# Patient Record
Sex: Female | Born: 1975
Health system: Southern US, Community
[De-identification: ages and names within clinical notes are randomized; demographics above are authoritative.]

---

## 2018-10-07 DIAGNOSIS — E119 Type 2 diabetes mellitus without complications: Secondary | ICD-10-CM

## 2018-10-07 HISTORY — DX: Type 2 diabetes mellitus without complications: E11.9

## 2020-01-24 ENCOUNTER — Inpatient Hospital Stay (HOSPITAL_COMMUNITY)
Admission: EM | Admit: 2020-01-24 | Discharge: 2020-01-27 | DRG: 041 | Disposition: A | Discharge status: Home / Self Care | Payer: Self-pay | Attending: Internal Medicine | Admitting: Internal Medicine

## 2020-01-24 ENCOUNTER — Emergency Department (HOSPITAL_COMMUNITY): Payer: Self-pay

## 2020-01-24 DIAGNOSIS — M869 Osteomyelitis, unspecified: Secondary | ICD-10-CM

## 2020-01-24 DIAGNOSIS — Z79899 Other long term (current) drug therapy: Secondary | ICD-10-CM

## 2020-01-24 DIAGNOSIS — Z20822 Contact with and (suspected) exposure to covid-19: Secondary | ICD-10-CM | POA: Diagnosis present

## 2020-01-24 DIAGNOSIS — E1140 Type 2 diabetes mellitus with diabetic neuropathy, unspecified: Principal | ICD-10-CM | POA: Diagnosis present

## 2020-01-24 DIAGNOSIS — L03115 Cellulitis of right lower limb: Principal | ICD-10-CM | POA: Diagnosis present

## 2020-01-24 DIAGNOSIS — E11621 Type 2 diabetes mellitus with foot ulcer: Secondary | ICD-10-CM | POA: Diagnosis present

## 2020-01-24 DIAGNOSIS — E1165 Type 2 diabetes mellitus with hyperglycemia: Secondary | ICD-10-CM | POA: Diagnosis present

## 2020-01-24 DIAGNOSIS — L089 Local infection of the skin and subcutaneous tissue, unspecified: Secondary | ICD-10-CM | POA: Diagnosis present

## 2020-01-24 DIAGNOSIS — L97516 Non-pressure chronic ulcer of other part of right foot with bone involvement without evidence of necrosis: Secondary | ICD-10-CM | POA: Diagnosis present

## 2020-01-24 DIAGNOSIS — E1169 Type 2 diabetes mellitus with other specified complication: Secondary | ICD-10-CM | POA: Diagnosis present

## 2020-01-24 LAB — COMPREHENSIVE METABOLIC PANEL
ALT: 22 U/L (ref 0–44)
AST: 18 U/L (ref 15–41)
Albumin: 3.3 g/dL — ABNORMAL LOW (ref 3.5–5.0)
Alkaline Phosphatase: 145 U/L — ABNORMAL HIGH (ref 38–126)
Anion gap: 12 (ref 5–15)
BUN: 9 mg/dL (ref 6–20)
CO2: 23 mmol/L (ref 22–32)
Calcium: 9.1 mg/dL (ref 8.9–10.3)
Chloride: 97 mmol/L — ABNORMAL LOW (ref 98–111)
Creatinine, Ser: 0.86 mg/dL (ref 0.44–1.00)
GFR, Estimated: >60 mL/min (ref >60)
Glucose, Bld: 491 mg/dL — ABNORMAL HIGH (ref 70–99)
Potassium: 4.1 mmol/L (ref 3.5–5.1)
Sodium: 132 mmol/L — ABNORMAL LOW (ref 135–145)
Total Bilirubin: 0.5 mg/dL (ref 0.3–1.2)
Total Protein: 7.4 g/dL (ref 6.5–8.1)

## 2020-01-24 LAB — CBC
HCT: 41 % (ref 36.0–46.0)
Hemoglobin: 14.1 g/dL (ref 12.0–15.0)
MCH: 31.5 pg (ref 26.0–34.0)
MCHC: 34.4 g/dL (ref 30.0–36.0)
MCV: 91.5 fL (ref 80.0–100.0)
Platelets: 272 10*3/uL (ref 150–400)
RBC: 4.48 MIL/uL (ref 3.87–5.11)
RDW: 12 % (ref 11.5–15.5)
WBC: 10.4 10*3/uL (ref 4.0–10.5)
nRBC: 0 % (ref 0.0–0.2)

## 2020-01-24 LAB — LACTIC ACID, PLASMA: Lactic Acid, Venous: 2.6 mmol/L (ref 0.5–1.9)

## 2020-01-24 IMAGING — CR DG FOOT COMPLETE 3+V*R*
3 series · 3 of 3 positions shown · non-contrast
Comparison: None.

CLINICAL DATA: Right foot swelling.  Wound near the fifth toe

EXAM:
RIGHT FOOT COMPLETE - 3+ VIEW

[foot ap]
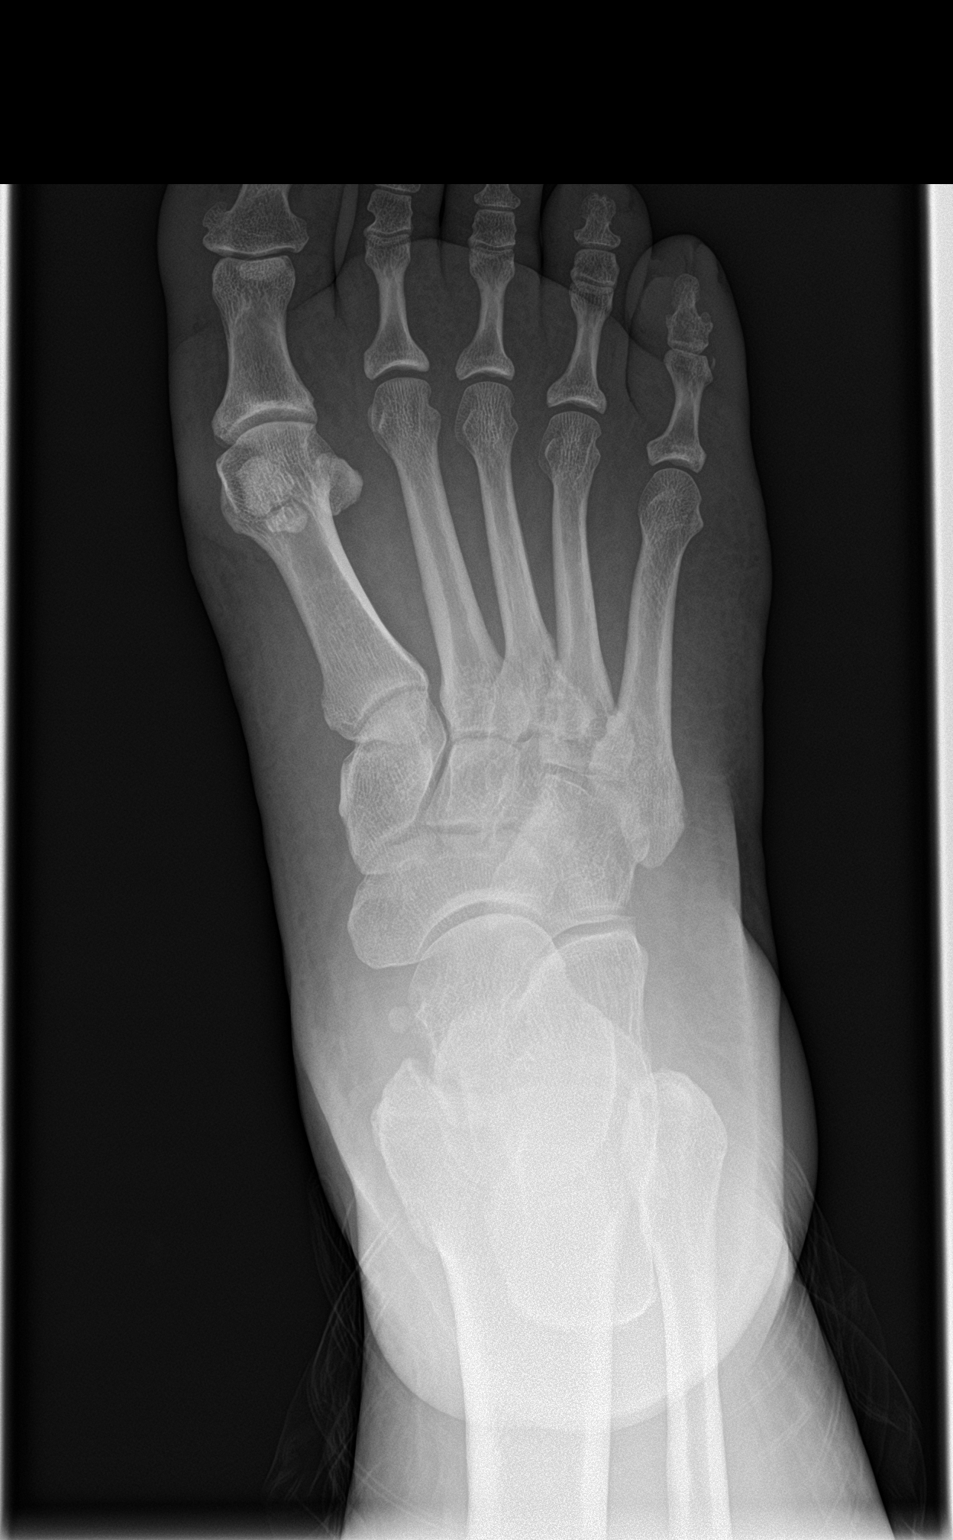

[foot obl]
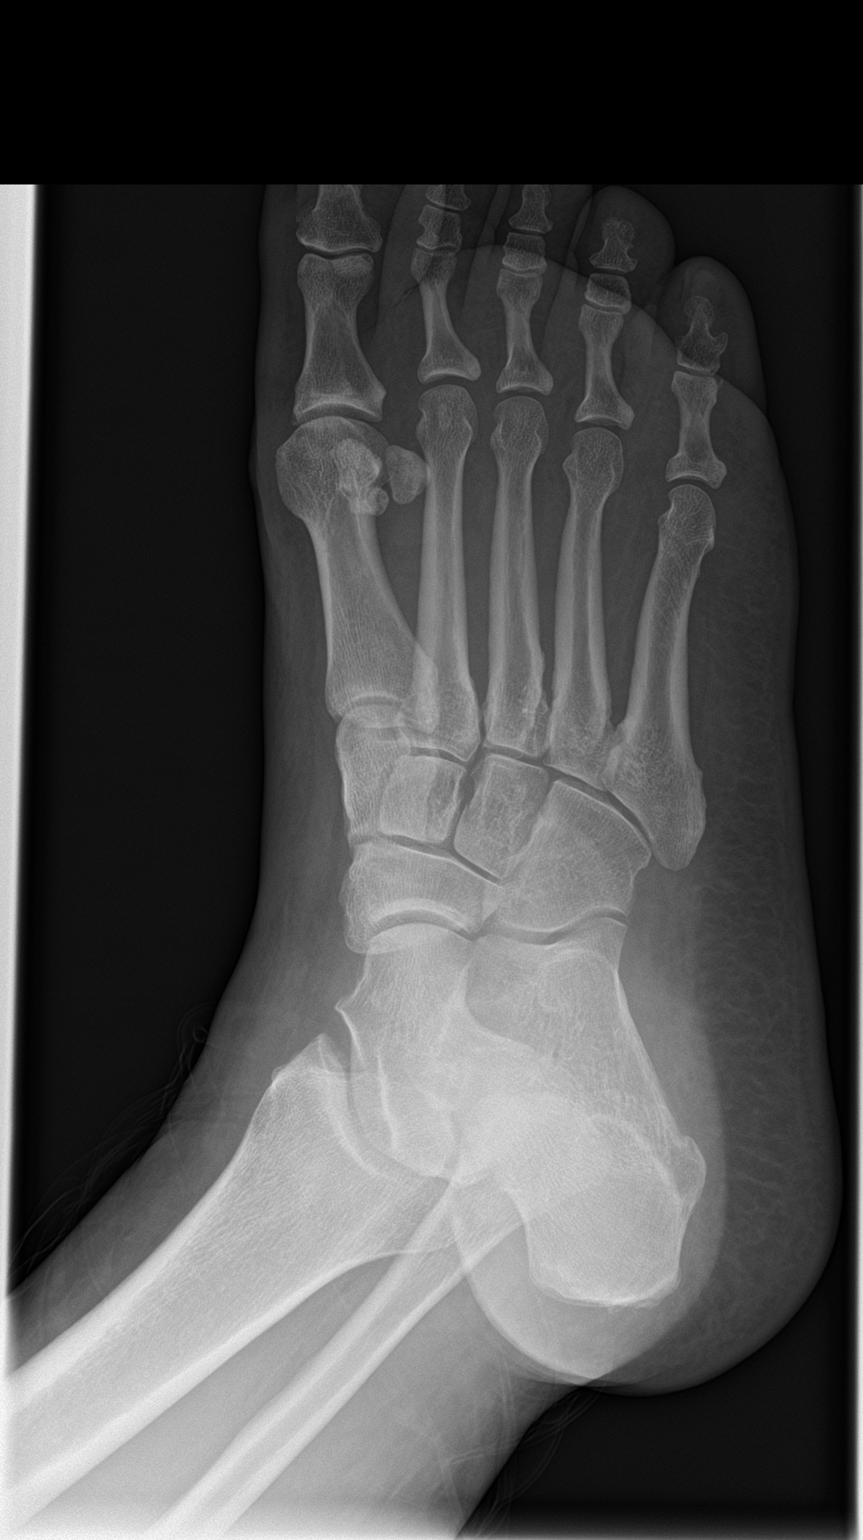

[foot lat]
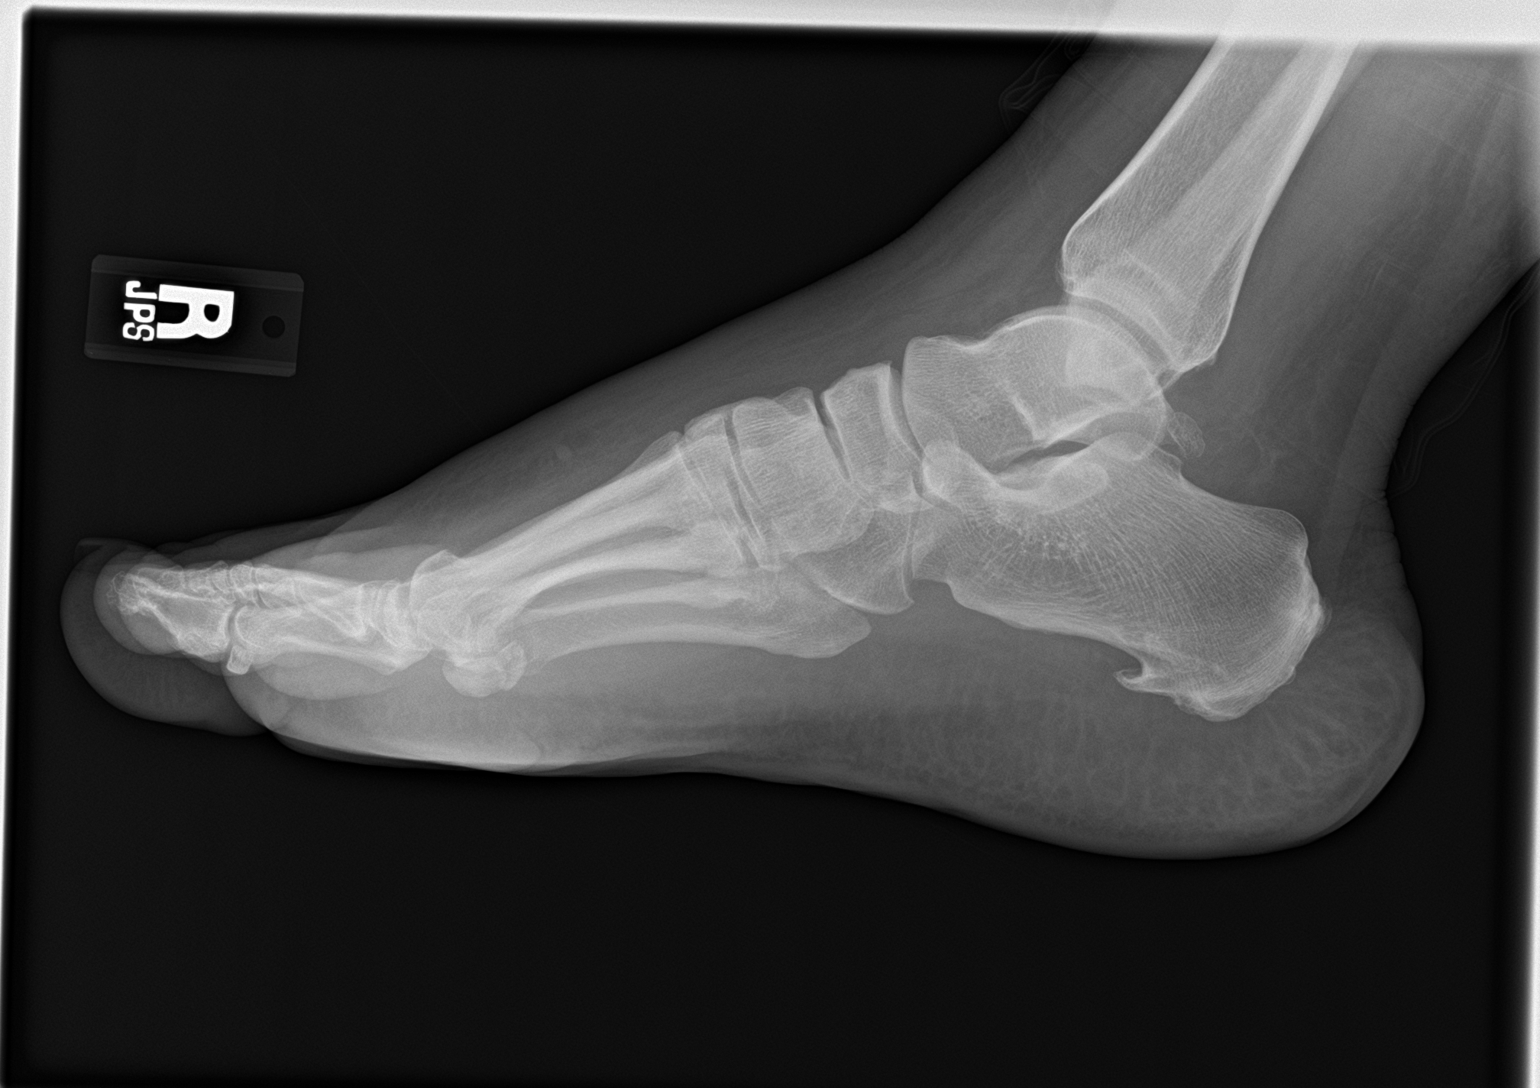

[3 of 3 positions shown; findings below may reference images not displayed]

FINDINGS: There is no evidence of fracture or dislocation. Small nonspecific
mineralized density along the lateral aspect of the fifth toe IP
joint, possibly a site of calcific periarthritis. No bony erosion or
periostitis. Small plantar calcaneal spur. There is no evidence of
arthropathy or other focal bone abnormality. No focal soft tissue
swelling, obvious ulceration, or soft tissue gas.
IMPRESSION: 1. No acute osseous abnormality. No radiographic evidence of
osteomyelitis.
2. Small nonspecific mineralized density along the lateral aspect of
the fifth toe IP joint, possibly a site of calcific periarthritis.

## 2020-01-24 NOTE — ED Triage Notes (Signed)
Pt here from home with c/o right foot and ankle swollen over the last 2 days , small toe has a wound on it and foot and ankle is swollen and red , no fever

## 2020-01-25 ENCOUNTER — Other Ambulatory Visit: Payer: Self-pay

## 2020-01-25 ENCOUNTER — Observation Stay (HOSPITAL_BASED_OUTPATIENT_CLINIC_OR_DEPARTMENT_OTHER): Payer: Self-pay

## 2020-01-25 ENCOUNTER — Observation Stay (HOSPITAL_COMMUNITY): Payer: Self-pay

## 2020-01-25 ENCOUNTER — Encounter (HOSPITAL_COMMUNITY): Payer: Self-pay | Admitting: Family Medicine

## 2020-01-25 ENCOUNTER — Other Ambulatory Visit: Payer: Self-pay | Admitting: Physician Assistant

## 2020-01-25 DIAGNOSIS — M869 Osteomyelitis, unspecified: Secondary | ICD-10-CM

## 2020-01-25 DIAGNOSIS — L0390 Cellulitis, unspecified: Secondary | ICD-10-CM

## 2020-01-25 DIAGNOSIS — E1165 Type 2 diabetes mellitus with hyperglycemia: Secondary | ICD-10-CM | POA: Diagnosis present

## 2020-01-25 DIAGNOSIS — L089 Local infection of the skin and subcutaneous tissue, unspecified: Secondary | ICD-10-CM | POA: Diagnosis present

## 2020-01-25 DIAGNOSIS — I739 Peripheral vascular disease, unspecified: Secondary | ICD-10-CM

## 2020-01-25 LAB — RESPIRATORY PANEL BY RT PCR (FLU A&B, COVID)
Influenza A by PCR: NEGATIVE
Influenza B by PCR: NEGATIVE
SARS Coronavirus 2 by RT PCR: NEGATIVE

## 2020-01-25 LAB — CBC WITH DIFFERENTIAL/PLATELET
Abs Immature Granulocytes: 0.03 10*3/uL (ref 0.00–0.07)
Basophils Absolute: 0 10*3/uL (ref 0.0–0.1)
Basophils Relative: 0 %
Eosinophils Absolute: 0.1 10*3/uL (ref 0.0–0.5)
Eosinophils Relative: 1 %
HCT: 36.3 % (ref 36.0–46.0)
Hemoglobin: 12.2 g/dL (ref 12.0–15.0)
Immature Granulocytes: 0 %
Lymphocytes Relative: 26 %
Lymphs Abs: 2.4 10*3/uL (ref 0.7–4.0)
MCH: 30.7 pg (ref 26.0–34.0)
MCHC: 33.6 g/dL (ref 30.0–36.0)
MCV: 91.4 fL (ref 80.0–100.0)
Monocytes Absolute: 0.7 10*3/uL (ref 0.1–1.0)
Monocytes Relative: 7 %
Neutro Abs: 6.1 10*3/uL (ref 1.7–7.7)
Neutrophils Relative %: 66 %
Platelets: 252 10*3/uL (ref 150–400)
RBC: 3.97 MIL/uL (ref 3.87–5.11)
RDW: 12.4 % (ref 11.5–15.5)
WBC: 9.3 10*3/uL (ref 4.0–10.5)
nRBC: 0 % (ref 0.0–0.2)

## 2020-01-25 LAB — GLUCOSE, CAPILLARY: Glucose-Capillary: 320 mg/dL — ABNORMAL HIGH (ref 70–99)

## 2020-01-25 LAB — BASIC METABOLIC PANEL
Anion gap: 11 (ref 5–15)
BUN: 13 mg/dL (ref 6–20)
CO2: 24 mmol/L (ref 22–32)
Calcium: 8.5 mg/dL — ABNORMAL LOW (ref 8.9–10.3)
Chloride: 100 mmol/L (ref 98–111)
Creatinine, Ser: 0.64 mg/dL (ref 0.44–1.00)
GFR, Estimated: >60 mL/min (ref >60)
Glucose, Bld: 315 mg/dL — ABNORMAL HIGH (ref 70–99)
Potassium: 3.8 mmol/L (ref 3.5–5.1)
Sodium: 135 mmol/L (ref 135–145)

## 2020-01-25 LAB — C-REACTIVE PROTEIN: CRP: 10.3 mg/dL — ABNORMAL HIGH (ref <1.0)

## 2020-01-25 LAB — LACTIC ACID, PLASMA: Lactic Acid, Venous: 1.7 mmol/L (ref 0.5–1.9)

## 2020-01-25 LAB — CBG MONITORING, ED
Glucose-Capillary: 237 mg/dL — ABNORMAL HIGH (ref 70–99)
Glucose-Capillary: 309 mg/dL — ABNORMAL HIGH (ref 70–99)
Glucose-Capillary: 297 mg/dL — ABNORMAL HIGH (ref 70–99)
Glucose-Capillary: 248 mg/dL — ABNORMAL HIGH (ref 70–99)

## 2020-01-25 LAB — SEDIMENTATION RATE: Sed Rate: 87 mm/hr — ABNORMAL HIGH (ref 0–22)

## 2020-01-25 LAB — PREALBUMIN: Prealbumin: 12.1 mg/dL — ABNORMAL LOW (ref 18–38)

## 2020-01-25 LAB — HIV ANTIBODY (ROUTINE TESTING W REFLEX): HIV Screen 4th Generation wRfx: NONREACTIVE

## 2020-01-25 IMAGING — MR MR FOOT*R* WO/W CM
9 series · 40 of 40 positions shown · IV contrast (gadavist)
Comparison: X-ray [DATE]

CLINICAL DATA: Diabetic foot wound to the right fifth toe

EXAM:
MRI OF THE RIGHT FOREFOOT WITHOUT AND WITH CONTRAST
TECHNIQUE: Multiplanar, multisequence MR imaging of the right forefoot was
performed before and after the administration of intravenous
contrast.
CONTRAST:  8mL GADAVIST GADOBUTROL 1 MMOL/ML IV SOLN

[Series 4: T1 · coronal · right · 3.0mm · 0.47mm/px · 4 of 40 slices shown (1 of 2)]
[im 1/40]
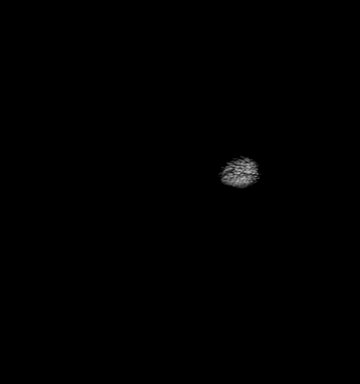
[im 14/40]
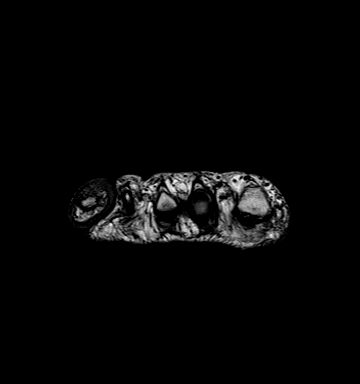
[im 27/40]
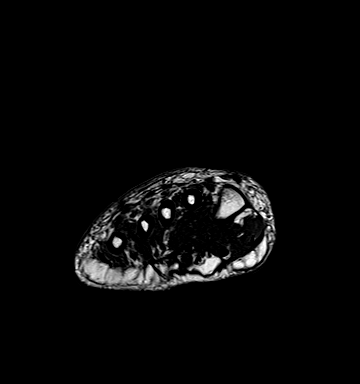
[im 40/40]
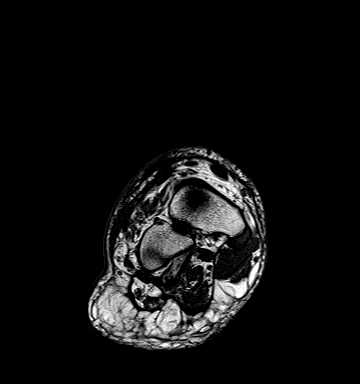

[Series 5: T2 fat-sat · coronal · right · 3.0mm · 0.47mm/px · 4 of 40 slices shown (1 of 2)]
[im 1/40]
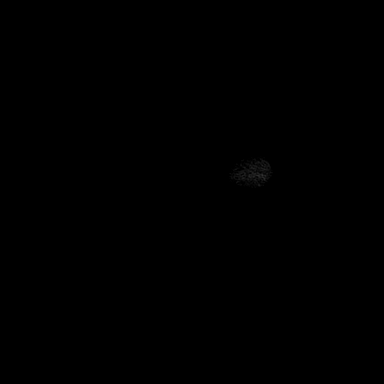
[im 14/40]
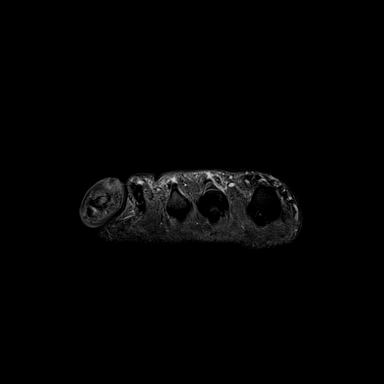
[im 27/40]
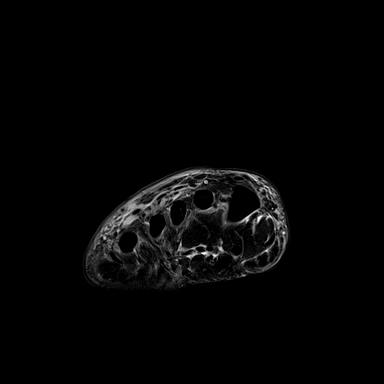
[im 40/40]
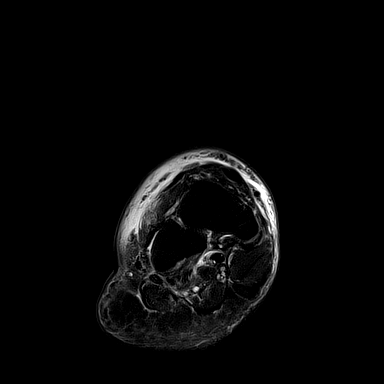

[Series 8: STIR · sagittal · right · 3.0mm · 0.86mm/px · 5 of 42 slices shown]
[im 1/42]
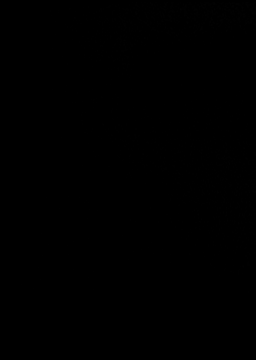
[im 11/42]
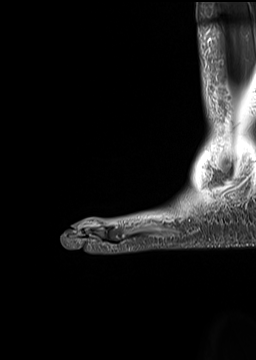
[im 21/42]
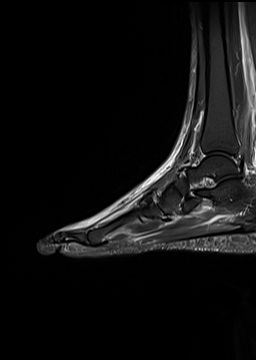
[im 31/42]
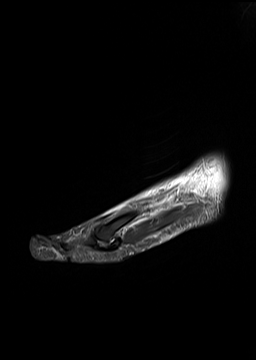
[im 42/42]
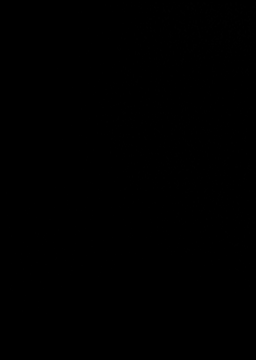

[Series 9: T2 fat-sat · axial · right · 3.0mm · 0.60mm/px · z∈[-103,+7]mm · 4 of 32 slices shown (2 of 2)]
[im 1/32]
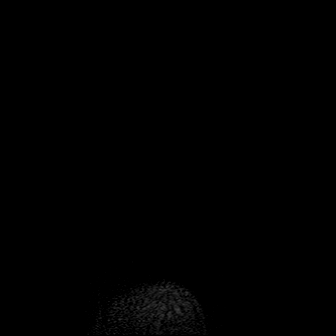
[im 11/32]
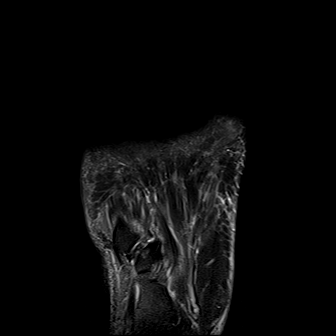
[im 21/32]
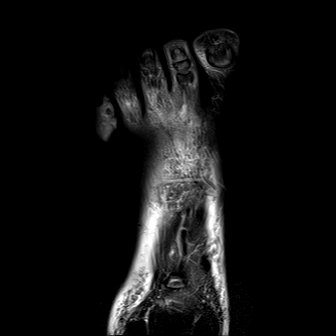
[im 32/32]
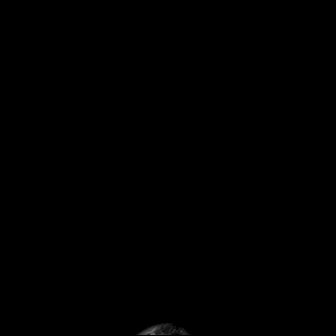

[Series 10: T1 · axial · right · 3.0mm · 0.52mm/px · z∈[-102,+9]mm · 4 of 32 slices shown (2 of 2)]
[im 1/32]
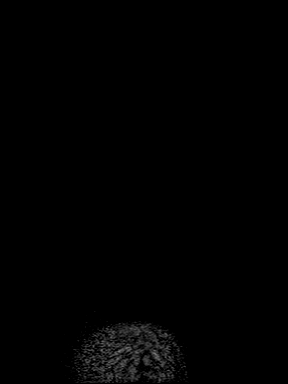
[im 11/32]
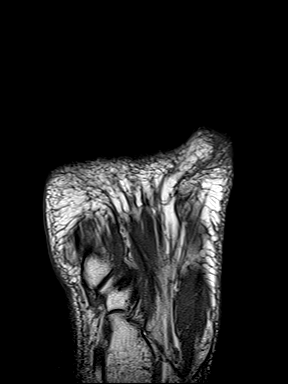
[im 21/32]
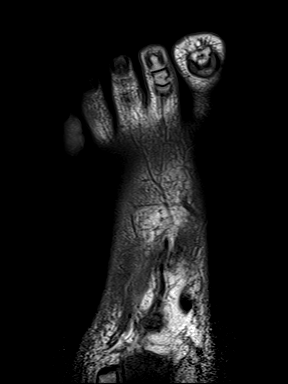
[im 32/32]
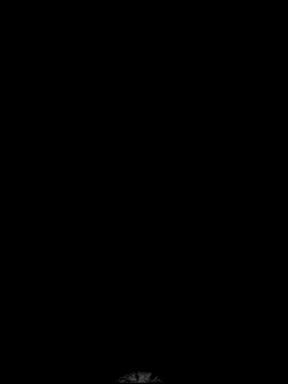

[Series 11: T1 fat-sat · coronal · non-contrast · right · 3.0mm · 0.59mm/px · 5 of 40 slices shown (1 of 3)]
[im 1/40]
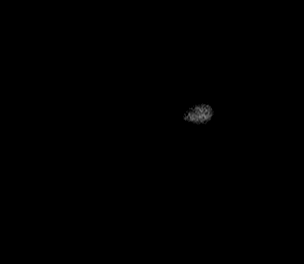
[im 10/40]
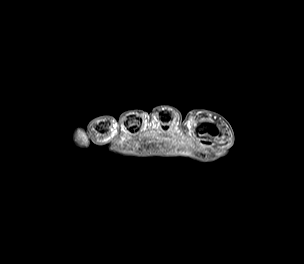
[im 20/40]
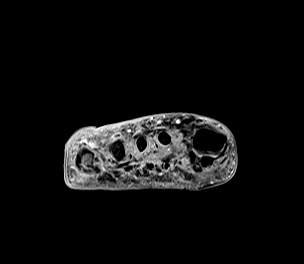
[im 30/40]
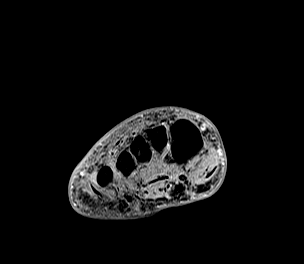
[im 40/40]
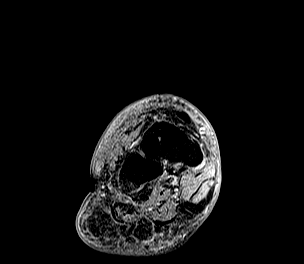

[Series 12: T1 fat-sat post-contrast · coronal · right · 3.0mm · 0.59mm/px · 5 of 40 slices shown]
[im 1/40]
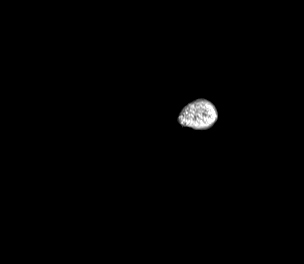
[im 10/40]
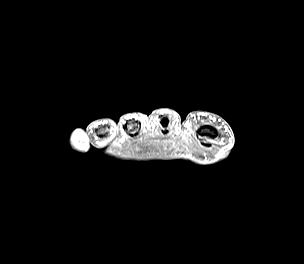
[im 20/40]
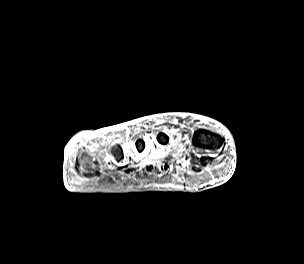
[im 30/40]
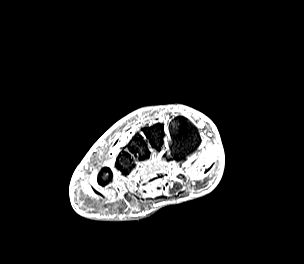
[im 40/40]
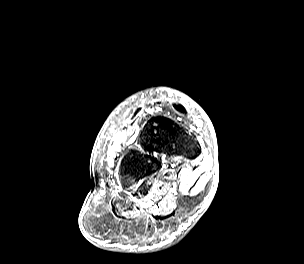

[Series 13: T1 fat-sat · sagittal · right · 3.0mm · 0.79mm/px · 5 of 41 slices shown (2 of 3)]
[im 1/41]
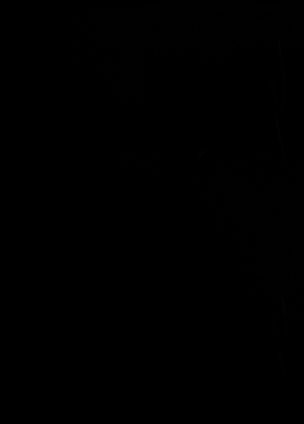
[im 11/41]
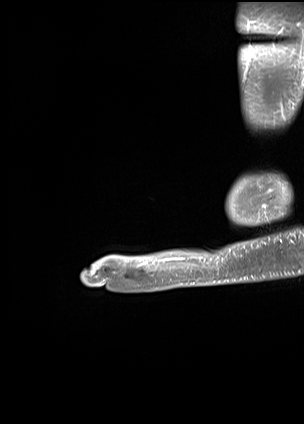
[im 21/41]
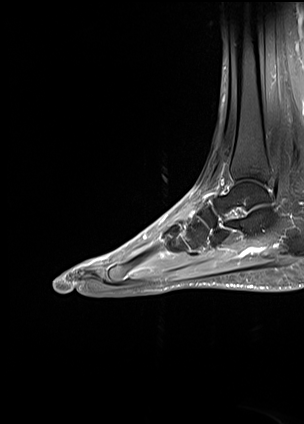
[im 31/41]
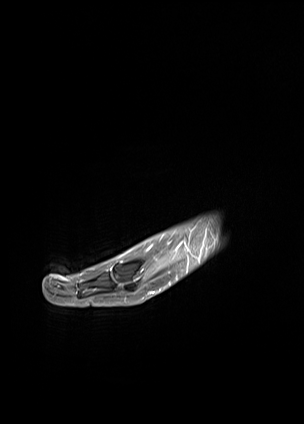
[im 41/41]
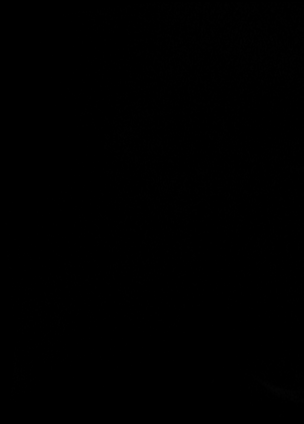

[Series 14: T1 fat-sat · axial · right · 3.0mm · 0.62mm/px · z∈[-103,+7]mm · 4 of 32 slices shown (3 of 3)]
[im 1/32]
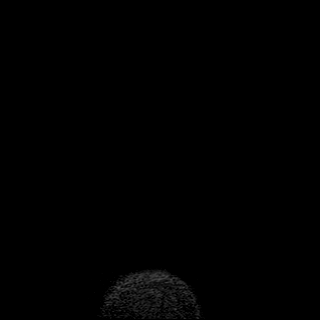
[im 11/32]
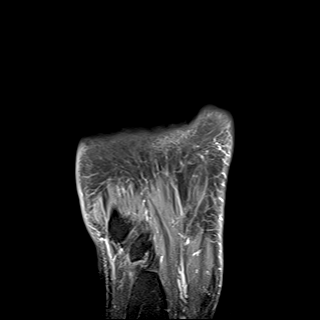
[im 21/32]
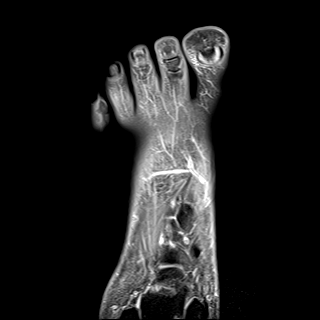
[im 32/32]
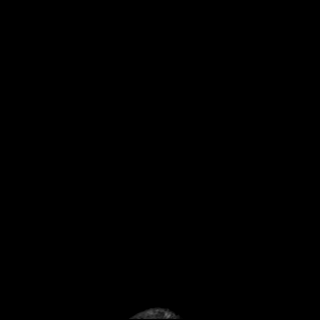

[40 of 40 positions shown; findings below may reference images not displayed]

FINDINGS: Bones/Joint/Cartilage

Bone marrow edema throughout the right fifth toe distal phalanx.
Subtle low T1 signal at the level of the distal tuft along the
dorsal margin (series 4, image 12) suspicious for a site of
osteomyelitis. Preservation of the bone marrow signal within the
proximal phalanx of the fifth toe. The remaining included osseous
structures of the forefoot are intact with normal signal. No acute
fracture. No dislocation. Minimal degenerative changes of the first
MTP joint. No joint effusion.

Ligaments

Intact Lisfranc ligament. Collateral ligaments of forefoot appear
intact. Intact plantar plates.

Muscles and Tendons

Muscle bulk and signal intensity is within normal limits. Intact
flexor and extensor tendons. No appreciable tenosynovial fluid
collection.

Soft tissues

Soft tissue edema and enhancement of the fifth toe with superficial
skin ulceration along the dorsal surface. Subcutaneous edema
overlies the dorsum of the forefoot. No organized soft tissue fluid
collection.
IMPRESSION: Superficial skin ulceration of the right fifth toe with surrounding
cellulitis. Bone marrow edema within the distal phalanx of the right
fifth toe with subtle T1 signal changes of the distal tuft
suspicious for a site of osteomyelitis.

These results will be called to the ordering clinician or
representative by the Radiologist Assistant, and communication
documented in the PACS or [REDACTED].

## 2020-01-25 MED ORDER — HYDROCODONE-ACETAMINOPHEN 5-325 MG PO TABS
1.0000 | ORAL_TABLET | ORAL | Status: DC | PRN
  Administered 2020-01-26 – 2020-01-27 (×3): 2 via ORAL
  Filled 2020-01-25 (×3): qty 2

## 2020-01-25 MED ORDER — MUPIROCIN 2 % EX OINT
1.0000 "application " | TOPICAL_OINTMENT | Freq: Two times a day (BID) | CUTANEOUS | Status: DC
  Administered 2020-01-25 – 2020-01-26 (×2): 1 via NASAL
  Filled 2020-01-25: qty 22

## 2020-01-25 MED ORDER — ACETAMINOPHEN 325 MG PO TABS: 650.0000 mg | ORAL_TABLET | Freq: Four times a day (QID) | ORAL | Status: DC | PRN

## 2020-01-25 MED ORDER — SODIUM CHLORIDE 0.9 % IV SOLN
2.0000 g | INTRAVENOUS | Status: DC
  Administered 2020-01-25 – 2020-01-27 (×3): 2 g via INTRAVENOUS
  Filled 2020-01-25 (×3): qty 20

## 2020-01-25 MED ORDER — GADOBUTROL 1 MMOL/ML IV SOLN
8.0000 mL | Freq: Once | INTRAVENOUS | Status: AC | PRN
  Administered 2020-01-25: 8 mL via INTRAVENOUS

## 2020-01-25 MED ORDER — OXYCODONE-ACETAMINOPHEN 5-325 MG PO TABS
2.0000 | ORAL_TABLET | Freq: Once | ORAL | Status: AC
  Administered 2020-01-25: 2 via ORAL
  Filled 2020-01-25: qty 2

## 2020-01-25 MED ORDER — ACETAMINOPHEN 650 MG RE SUPP: 650.0000 mg | Freq: Four times a day (QID) | RECTAL | Status: DC | PRN

## 2020-01-25 MED ORDER — PIPERACILLIN-TAZOBACTAM 3.375 G IVPB 30 MIN
3.3750 g | Freq: Once | INTRAVENOUS | Status: AC
  Administered 2020-01-25: 3.375 g via INTRAVENOUS
  Filled 2020-01-25: qty 50

## 2020-01-25 MED ORDER — VANCOMYCIN HCL IN DEXTROSE 1-5 GM/200ML-% IV SOLN
1000.0000 mg | Freq: Two times a day (BID) | INTRAVENOUS | Status: DC
  Administered 2020-01-26: 1000 mg via INTRAVENOUS
  Filled 2020-01-25 (×2): qty 200

## 2020-01-25 MED ORDER — ONDANSETRON HCL 4 MG/2ML IJ SOLN: 4.0000 mg | Freq: Four times a day (QID) | INTRAMUSCULAR | Status: DC | PRN

## 2020-01-25 MED ORDER — SODIUM CHLORIDE 0.9 % IV BOLUS
1000.0000 mL | Freq: Once | INTRAVENOUS | Status: AC
  Administered 2020-01-25: 1000 mL via INTRAVENOUS

## 2020-01-25 MED ORDER — INSULIN ASPART 100 UNIT/ML ~~LOC~~ SOLN
0.0000 [IU] | Freq: Every day | SUBCUTANEOUS | Status: DC
  Administered 2020-01-25 – 2020-01-26 (×2): 4 [IU] via SUBCUTANEOUS

## 2020-01-25 MED ORDER — ENOXAPARIN SODIUM 40 MG/0.4ML ~~LOC~~ SOLN
40.0000 mg | SUBCUTANEOUS | Status: DC
  Administered 2020-01-25 – 2020-01-27 (×3): 40 mg via SUBCUTANEOUS
  Filled 2020-01-25 (×3): qty 0.4

## 2020-01-25 MED ORDER — VANCOMYCIN HCL 1.5 G IV SOLR
1500.0000 mg | Freq: Once | INTRAVENOUS | Status: AC
  Administered 2020-01-25: 1500 mg via INTRAVENOUS
  Filled 2020-01-25: qty 1500

## 2020-01-25 MED ORDER — INSULIN GLARGINE 100 UNIT/ML ~~LOC~~ SOLN
10.0000 [IU] | Freq: Every day | SUBCUTANEOUS | Status: DC
  Administered 2020-01-25 – 2020-01-27 (×3): 10 [IU] via SUBCUTANEOUS
  Filled 2020-01-25 (×4): qty 0.1

## 2020-01-25 MED ORDER — INSULIN ASPART 100 UNIT/ML ~~LOC~~ SOLN
0.0000 [IU] | Freq: Three times a day (TID) | SUBCUTANEOUS | Status: DC
  Administered 2020-01-25: 7 [IU] via SUBCUTANEOUS
  Administered 2020-01-25 (×2): 3 [IU] via SUBCUTANEOUS
  Administered 2020-01-26: 2 [IU] via SUBCUTANEOUS
  Administered 2020-01-26 – 2020-01-27 (×3): 5 [IU] via SUBCUTANEOUS

## 2020-01-25 MED ORDER — METRONIDAZOLE IN NACL 5-0.79 MG/ML-% IV SOLN
500.0000 mg | Freq: Three times a day (TID) | INTRAVENOUS | Status: DC
  Administered 2020-01-25 – 2020-01-27 (×6): 500 mg via INTRAVENOUS
  Filled 2020-01-25 (×6): qty 100

## 2020-01-25 MED ORDER — POLYETHYLENE GLYCOL 3350 17 G PO PACK: 17.0000 g | PACK | Freq: Every day | ORAL | Status: DC | PRN

## 2020-01-25 MED ORDER — ONDANSETRON HCL 4 MG PO TABS: 4.0000 mg | ORAL_TABLET | Freq: Four times a day (QID) | ORAL | Status: DC | PRN

## 2020-01-25 NOTE — ED Provider Notes (Addendum)
[facility] EMERGENCY DEPARTMENT Provider Note   CSN: [id number] Arrival date & time: 01/24/20  1125     History Chief Complaint  Patient presents with  . Wound Infection    Alison Gates is a 44 y.o. female.  The history is provided by the patient and medical records.    44 year old female with history of poorly controlled DM2, presenting to the ED with wound of right foot. States this first appeared a week ago and has been worsening. States this started as a small blister which she tried to pop, however in the subsequent days foot began swelling, becoming red, and now she is running fevers. She denies any drainage from the wound. She denies any other injury or trauma. She has not had any cut or laceration. States her sugars have been high at home but that is not unusual. States she takes her medications as prescribed but still has elevated readings. She is not had any nausea or vomiting. She has never had diabetic foot wounds in the past.  No past medical history on file.  There are no problems to display for this patient.     OB History   No obstetric history on file.     No family history on file.  Social History   Tobacco Use  . Smoking status: Not on file  Substance Use Topics  . Alcohol use: Not on file  . Drug use: Not on file    Home Medications Prior to Admission medications   Not on File    Allergies    Patient has no allergy information on record.  Review of Systems   Review of Systems  Skin: Positive for wound.  All other systems reviewed and are negative.   Physical Exam Updated Vital Signs BP 126/74   Pulse 85   Temp 98.1 F (36.7 C) (Oral)   Resp 16   SpO2 97%   Physical Exam Vitals and nursing note reviewed.  Constitutional:      Appearance: She is well-developed.  HENT:     Head: Normocephalic and atraumatic.  Eyes:     Conjunctiva/sclera: Conjunctivae normal.     Pupils: Pupils are equal, round, and  reactive to light.  Cardiovascular:     Rate and Rhythm: Normal rate and regular rhythm.     Heart sounds: Normal heart sounds.  Pulmonary:     Effort: Pulmonary effort is normal.     Breath sounds: Normal breath sounds.  Abdominal:     General: Bowel sounds are normal.     Palpations: Abdomen is soft.  Musculoskeletal:        General: Normal range of motion.     Cervical back: Normal range of motion.     Comments: Right 5th toe with ulceration and discoloration along medial aspect; there is erythema and swelling extending from dorsal foot into right ankle and distal right leg; good distal sensation and cap refill, foot is warm to touch without noted crepitus  Skin:    General: Skin is warm and dry.  Neurological:     Mental Status: She is alert and oriented to person, place, and time.       ED Results / Procedures / Treatments   Labs (all labs ordered are listed, but only abnormal results are displayed) Labs Reviewed  COMPREHENSIVE METABOLIC PANEL - Abnormal; Notable for the following components:      Result Value   Sodium 132 (*)    Chloride 97 (*)  Glucose, Bld 491 (*)    Albumin 3.3 (*)    Alkaline Phosphatase 145 (*)    All other components within normal limits  LACTIC ACID, PLASMA - Abnormal; Notable for the following components:   Lactic Acid, Venous 2.6 (*)    All other components within normal limits  CULTURE, BLOOD (ROUTINE X 2)  CULTURE, BLOOD (ROUTINE X 2)  CBC  LACTIC ACID, PLASMA    EKG None  Radiology DG Foot Complete Right  Result Date: 01/24/2020 CLINICAL DATA:  Right foot swelling.  Wound near the fifth toe EXAM: RIGHT FOOT COMPLETE - 3+ VIEW COMPARISON:  None. FINDINGS: There is no evidence of fracture or dislocation. Small nonspecific mineralized density along the lateral aspect of the fifth toe IP joint, possibly a site of calcific periarthritis. No bony erosion or periostitis. Small plantar calcaneal spur. There is no evidence of arthropathy  or other focal bone abnormality. No focal soft tissue swelling, obvious ulceration, or soft tissue gas. IMPRESSION: 1. No acute osseous abnormality. No radiographic evidence of osteomyelitis. 2. Small nonspecific mineralized density along the lateral aspect of the fifth toe IP joint, possibly a site of calcific periarthritis. Electronically Signed   By: Duanne Guess D.O.   On: 01/24/2020 13:14    Procedures Procedures (including critical care time)  CRITICAL CARE Performed by: Garlon Hatchet   Total critical care time: 40 minutes  Critical care time was exclusive of separately billable procedures and treating other patients.  Critical care was necessary to treat or prevent imminent or life-threatening deterioration.  Critical care was time spent personally by me on the following activities: development of treatment plan with patient and/or surrogate as well as nursing, discussions with consultants, evaluation of patient's response to treatment, examination of patient, obtaining history from patient or surrogate, ordering and performing treatments and interventions, ordering and review of laboratory studies, ordering and review of radiographic studies, pulse oximetry and re-evaluation of patient's condition.   Medications Ordered in ED Medications - No data to display  ED Course  I have reviewed the triage vital signs and the nursing notes.  Pertinent labs & imaging results that were available during my care of the patient were reviewed by me and considered in my medical decision making (see chart for details).    MDM Rules/Calculators/A&P  44 year old female presenting to the ED with wound of right fifth toe.  States it started as a blister about a week ago but she tried to pop it on her own at home and has since had worsening swelling and redness now extending up the leg.  She is afebrile and nontoxic in appearance here.  Does have wound noted to medial right fifth toe, appears to  have some purulence beneath with erythema from the toe, dorsal foot, right ankle, distal shin.  Her foot is neurovascularly intact, no tissue crepitus.  Labs as above, normal white count but elevated lactate of 2.6.  Blood cultures have been sent and are pending.  Also has hyperglycemia without findings of DKA.  Admits that her sugars are always "high".  X-ray without findings of osteomyelitis, however I continue to have suspicion for such.  Feel she will need admission with IV antibiotics, tight glucose control, and possibly MRI of the foot for further evaluation.  Will start IVF, vanc, zosyn.  Discussed with hospitalist, Dr. Antionette Char-- will admit for ongoing care.  Final Clinical Impression(s) / ED Diagnoses Final diagnoses:  Cellulitis of right foot    Rx / DC  Orders ED Discharge Orders    None       Garlon Hatchet, PA-C 01/25/20 0315    Garlon Hatchet, PA-C 01/25/20 [id number]    Derwood Kaplan, MD 01/26/20 0130

## 2020-01-25 NOTE — ED Notes (Signed)
Called pharmacy requesting missing lantus and vancomycin

## 2020-01-25 NOTE — Progress Notes (Signed)
Pharmacy Antibiotic Note  Alison Gates is a 44 y.o. female admitted on 01/24/2020 with diabetic foot.  Pharmacy has been consulted for vancomycin dosing.  Plan: Vancomycin 1500 mg IV x 1 then 1000 mg IV q12 hours F/u renal function, cultures and clinical course  Height: 5\' 1"  (154.9 cm) Weight: 81.6 kg (180 lb) (from Dec 2020 records) IBW/kg (Calculated) : 47.8  Temp (24hrs), Avg:98.4 F (36.9 C), Min:98.1 F (36.7 C), Max:98.6 F (37 C)  Recent Labs  Lab 01/24/20 1238 01/24/20 1240  WBC 10.4  --   CREATININE 0.86  --   LATICACIDVEN  --  2.6*    Estimated Creatinine Clearance: 81.6 mL/min (by C-G formula based on SCr of 0.86 mg/dL).    Not on File  Thank you for allowing pharmacy to be a part of this patient's care.  01/26/20 [name] 01/25/2020 3:18 AM

## 2020-01-25 NOTE — Progress Notes (Signed)
   Follow Up Note  HPI: Please see full H&P done earlier today Briefly 44 year old female with history of poorly controlled DM type 2, presenting with a right fifth toe wound with progressive surrounding erythema and swelling. MRI R foot showed possible osteomyelitis. Cultures taken, patient started on broad-spectrum antibiotics, orthopedics Dr. Lajoyce Corners consulted.   Today, patient denies any new complaints, reports pain in her right foot has improved.  Exam: CV: S1, S2 present Lungs: CTA B Abd: Soft, nontender, nondistended, bowel sounds present Ext: Bloody crust noted at the right fifth toe, with erythema and mild edema  Present on Admission: . Right foot infection . Diabetes mellitus type II, uncontrolled (HCC)   Assessment and plan Right diabetic foot ulcer with osteomyelitis Plan is to continue IV antibiotics Orthopedics consulted, appreciate recs Strict blood sugar control  Diabetes mellitus type 2, uncontrolled with hyperglycemia A1c pending SSI, started Lantus, Accu-Cheks, hypoglycemic protocol, adjust as needed

## 2020-01-25 NOTE — ED Notes (Signed)
CBG 248  

## 2020-01-25 NOTE — ED Notes (Signed)
Requested hospital bed for pt comfort

## 2020-01-25 NOTE — Consult Note (Addendum)
WOC Nurse Consult Note: Reason for Consult: Consult requested for right foot wound. Performed remotely after review of progress notes and photos in the EMR. Pt has cellulitis to right foot and has been placed on systemic antibiotics. Wound type: Right 5th toe with full thickness wound; dark red-purple, dry and blistering, generalized edema and erythemia and some tan drainage. MRI indicates; signal changes of the distal tuft suspicious for a site of osteomyelitis" An ortho consult  is necessary to provide further plan of care for this complex medical condition. Secure chat message sent to primary team to inform them of these results.  Dressing procedure/placement/frequency: Topical treatment provided for bedside nurses to perform as follows: Apply xeroform gauze to right outer toe Q day and cover with kerlex until further feedback is available from the ortho service.  Please re-consult if further assistance is needed.  Thank-you,  Cammie Mcgee MSN, RN, CWOCN, [place], CNS [phone]

## 2020-01-25 NOTE — ED Notes (Signed)
Transport requested for pt transfer to [phone]

## 2020-01-25 NOTE — H&P (Signed)
History and Physical    Alison Gates FAO:[id number] DOB: 02/03/1976 DOA: 01/24/2020  PCP: Patient, No Pcp Per   Patient coming from: Home   Chief Complaint: Right foot redness and swelling   HPI: Alison Gates is a 44 y.o. female with medical history significant for poorly controlled type 2 diabetes mellitus, now presenting to the emergency department with a right fifth toe wound and progressive surrounding erythema and swelling.  Patient noticed a small wound and blister on her fifth right toe approximately 2 weeks ago and now presents with progressive erythema, swelling, and tenderness which was initially localized to the toe but has since spread to involve the foot and ankle.  She has had some bloody and yellow drainage from the fifth toe.  She reports subjective fevers.  She denies any other rashes or wounds, denies chest pain or shortness of breath, and denies lightheadedness or diaphoresis. She denies history of numbness, tingling, or burning sensation in her feet.   ED Course: Upon arrival to the ED, patient is found to be afebrile, saturating well on room air, and with stable blood pressure.  Chemistry panel is notable for glucose of 491 without DKA.  CBC is unremarkable.  Lactic acid is elevated to 2.6.  Plain radiographs of the right foot are negative for acute osseous abnormality or evidence for osteomyelitis.  Covid screening test not yet collected.  Blood cultures were obtained in the emergency department and the patient was started on empiric broad-spectrum antibiotics.  Review of Systems:  All other systems reviewed and apart from HPI, are negative.  History reviewed. No pertinent past medical history.  History reviewed. No pertinent surgical history.  Social History:   has no history on file for tobacco use, alcohol use, and drug use.  Not on File  History reviewed. No pertinent family history.   Prior to Admission medications   Not on File    Physical  Exam: Vitals:   01/24/20 1543 01/24/20 2330 01/25/20 0229 01/25/20 0241  BP: 132/78 139/76 126/74   Pulse: 94 95 85   Resp: 12 16 16    Temp:  98.6 F (37 C) 98.1 F (36.7 C)   TempSrc:  Oral Oral   SpO2: 98% 99% 97%   Weight:    81.6 kg  Height:    5\' 1"  (1.549 m)    Constitutional: NAD, calm  Eyes: PERTLA, lids and conjunctivae normal ENMT: Mucous membranes are moist. Posterior pharynx clear of any exudate or lesions.   Neck: normal, supple, no masses, no thyromegaly Respiratory:  no wheezing, no crackles. No accessory muscle use.  Cardiovascular: S1 & S2 heard, regular rate and rhythm. No JVD.   Abdomen: No distension, no tenderness, soft. Bowel sounds active.  Musculoskeletal: no clubbing / cyanosis. No joint deformity upper and lower extremities.   Skin: Crust at right 5th toe, erythema and induration involving right foot and ankle. Warm, dry, well-perfused. Neurologic: No facial asymmetry. Sensation to light touch intact. Moving all extremities.  Psychiatric: Alert and oriented to person, place, and situation. Pleasant and cooperative.    Labs and Imaging on Admission: I have personally reviewed following labs and imaging studies  CBC: Recent Labs  Lab 01/24/20 1238  WBC 10.4  HGB 14.1  HCT 41.0  MCV 91.5  PLT 272   Basic Metabolic Panel: Recent Labs  Lab 01/24/20 1238  NA 132*  K 4.1  CL 97*  CO2 23  GLUCOSE 491*  BUN 9  CREATININE 0.86  CALCIUM 9.1  GFR: Estimated Creatinine Clearance: 81.6 mL/min (by C-G formula based on SCr of 0.86 mg/dL). Liver Function Tests: Recent Labs  Lab 01/24/20 1238  AST 18  ALT 22  ALKPHOS 145*  BILITOT 0.5  PROT 7.4  ALBUMIN 3.3*   No results for input(s): LIPASE, AMYLASE in the last 168 hours. No results for input(s): AMMONIA in the last 168 hours. Coagulation Profile: No results for input(s): INR, PROTIME in the last 168 hours. Cardiac Enzymes: No results for input(s): CKTOTAL, CKMB, CKMBINDEX, TROPONINI in  the last 168 hours. BNP (last 3 results) No results for input(s): PROBNP in the last 8760 hours. HbA1C: No results for input(s): HGBA1C in the last 72 hours. CBG: No results for input(s): GLUCAP in the last 168 hours. Lipid Profile: No results for input(s): CHOL, HDL, LDLCALC, TRIG, CHOLHDL, LDLDIRECT in the last 72 hours. Thyroid Function Tests: No results for input(s): TSH, T4TOTAL, FREET4, T3FREE, THYROIDAB in the last 72 hours. Anemia Panel: No results for input(s): VITAMINB12, FOLATE, FERRITIN, TIBC, IRON, RETICCTPCT in the last 72 hours. Urine analysis: No results found for: COLORURINE, APPEARANCEUR, LABSPEC, PHURINE, GLUCOSEU, HGBUR, BILIRUBINUR, KETONESUR, PROTEINUR, UROBILINOGEN, NITRITE, LEUKOCYTESUR Sepsis Labs: @LABRCNTIP (procalcitonin:4,lacticidven:4) )No results found for this or any previous visit (from the past 240 hour(s)).   Radiological Exams on Admission: DG Foot Complete Right  Result Date: 01/24/2020 CLINICAL DATA:  Right foot swelling.  Wound near the fifth toe EXAM: RIGHT FOOT COMPLETE - 3+ VIEW COMPARISON:  None. FINDINGS: There is no evidence of fracture or dislocation. Small nonspecific mineralized density along the lateral aspect of the fifth toe IP joint, possibly a site of calcific periarthritis. No bony erosion or periostitis. Small plantar calcaneal spur. There is no evidence of arthropathy or other focal bone abnormality. No focal soft tissue swelling, obvious ulceration, or soft tissue gas. IMPRESSION: 1. No acute osseous abnormality. No radiographic evidence of osteomyelitis. 2. Small nonspecific mineralized density along the lateral aspect of the fifth toe IP joint, possibly a site of calcific periarthritis. Electronically Signed   By: 01/26/2020 D.O.   On: 01/24/2020 13:14    Assessment/Plan   1. Right foot cellulitis  - Uncontrolled diabetic presenting with atraumatic right 5th toe wound and progressive surrounding redness and swelling  - No  evidence for osteomyelitis on plain radiographs  - Blood culture collected in ED and broad-spectrum IV antibiotics started  - Continue empiric broad-spectrum antibiotics, trend inflammatory markers, control sugars, check ABI, check MRI, follow cultures and clinical response to antibiotics   2. Uncontrolled type II DM  - A1c was 12.6% a year ago and serum glucose is 491 on admission without DKA  - Pharmacy medication-reconciliation pending, will check CBGs and use SSI for now, update A1c, consult diabetes coordinator given poor control and foot wound     DVT prophylaxis: Lovenox  Code Status: Full  Family Communication: Discussed with patient  Disposition Plan:  Patient is from: Home  Anticipated d/c is to: Home  Anticipated d/c date is: 01/26/20 Patient currently: Pending improvement with antibiotics, MRI foot  Consults called: None  Admission status: Observation     01/28/20, MD [facility]  01/25/2020, 3:20 AM

## 2020-01-26 ENCOUNTER — Encounter (HOSPITAL_COMMUNITY)
Admission: EM | Disposition: A | Discharge status: Home / Self Care | Payer: Self-pay | Source: Home / Self Care | Attending: Internal Medicine

## 2020-01-26 ENCOUNTER — Inpatient Hospital Stay (HOSPITAL_COMMUNITY): Payer: Self-pay | Admitting: Anesthesiology

## 2020-01-26 ENCOUNTER — Encounter (HOSPITAL_COMMUNITY): Payer: Self-pay | Admitting: Internal Medicine

## 2020-01-26 DIAGNOSIS — L089 Local infection of the skin and subcutaneous tissue, unspecified: Secondary | ICD-10-CM

## 2020-01-26 DIAGNOSIS — E1165 Type 2 diabetes mellitus with hyperglycemia: Secondary | ICD-10-CM

## 2020-01-26 DIAGNOSIS — M869 Osteomyelitis, unspecified: Secondary | ICD-10-CM

## 2020-01-26 HISTORY — PX: AMPUTATION: SHX166

## 2020-01-26 LAB — C-REACTIVE PROTEIN: CRP: 8.6 mg/dL — ABNORMAL HIGH (ref <1.0)

## 2020-01-26 LAB — SURGICAL PCR SCREEN
MRSA, PCR: NEGATIVE
Staphylococcus aureus: NEGATIVE

## 2020-01-26 LAB — BASIC METABOLIC PANEL
Anion gap: 9 (ref 5–15)
BUN: 10 mg/dL (ref 6–20)
CO2: 26 mmol/L (ref 22–32)
Calcium: 9.1 mg/dL (ref 8.9–10.3)
Chloride: 100 mmol/L (ref 98–111)
Creatinine, Ser: 0.56 mg/dL (ref 0.44–1.00)
GFR, Estimated: >60 mL/min (ref >60)
Glucose, Bld: 245 mg/dL — ABNORMAL HIGH (ref 70–99)
Potassium: 3.7 mmol/L (ref 3.5–5.1)
Sodium: 135 mmol/L (ref 135–145)

## 2020-01-26 LAB — CBC WITH DIFFERENTIAL/PLATELET
Abs Immature Granulocytes: 0.04 10*3/uL (ref 0.00–0.07)
Basophils Absolute: 0.1 10*3/uL (ref 0.0–0.1)
Basophils Relative: 1 %
Eosinophils Absolute: 0.1 10*3/uL (ref 0.0–0.5)
Eosinophils Relative: 1 %
HCT: 36.4 % (ref 36.0–46.0)
Hemoglobin: 12.4 g/dL (ref 12.0–15.0)
Immature Granulocytes: 0 %
Lymphocytes Relative: 31 %
Lymphs Abs: 2.9 10*3/uL (ref 0.7–4.0)
MCH: 30.5 pg (ref 26.0–34.0)
MCHC: 34.1 g/dL (ref 30.0–36.0)
MCV: 89.7 fL (ref 80.0–100.0)
Monocytes Absolute: 0.7 10*3/uL (ref 0.1–1.0)
Monocytes Relative: 7 %
Neutro Abs: 5.7 10*3/uL (ref 1.7–7.7)
Neutrophils Relative %: 60 %
Platelets: 271 10*3/uL (ref 150–400)
RBC: 4.06 MIL/uL (ref 3.87–5.11)
RDW: 12.1 % (ref 11.5–15.5)
WBC: 9.6 10*3/uL (ref 4.0–10.5)
nRBC: 0 % (ref 0.0–0.2)

## 2020-01-26 LAB — SEDIMENTATION RATE: Sed Rate: 96 mm/hr — ABNORMAL HIGH (ref 0–22)

## 2020-01-26 LAB — GLUCOSE, CAPILLARY
Glucose-Capillary: 267 mg/dL — ABNORMAL HIGH (ref 70–99)
Glucose-Capillary: 193 mg/dL — ABNORMAL HIGH (ref 70–99)
Glucose-Capillary: 194 mg/dL — ABNORMAL HIGH (ref 70–99)
Glucose-Capillary: 222 mg/dL — ABNORMAL HIGH (ref 70–99)
Glucose-Capillary: 330 mg/dL — ABNORMAL HIGH (ref 70–99)

## 2020-01-26 LAB — HEMOGLOBIN A1C
Hgb A1c MFr Bld: 11 % — ABNORMAL HIGH (ref 4.8–5.6)
Mean Plasma Glucose: 269 mg/dL

## 2020-01-26 SURGERY — AMPUTATION DIGIT
Anesthesia: Monitor Anesthesia Care | Laterality: Right

## 2020-01-26 MED ORDER — PROPOFOL 10 MG/ML IV BOLUS
INTRAVENOUS | Status: AC
  Filled 2020-01-26: qty 20

## 2020-01-26 MED ORDER — ONDANSETRON HCL 4 MG/2ML IJ SOLN
INTRAMUSCULAR | Status: AC
  Filled 2020-01-26: qty 2

## 2020-01-26 MED ORDER — FENTANYL CITRATE (PF) 100 MCG/2ML IJ SOLN: 25.0000 ug | INTRAMUSCULAR | Status: DC | PRN

## 2020-01-26 MED ORDER — LIDOCAINE 2% (20 MG/ML) 5 ML SYRINGE
INTRAMUSCULAR | Status: AC
  Filled 2020-01-26: qty 5

## 2020-01-26 MED ORDER — PROPOFOL 10 MG/ML IV BOLUS
INTRAVENOUS | Status: DC | PRN
  Administered 2020-01-26: 15 mg via INTRAVENOUS

## 2020-01-26 MED ORDER — ONDANSETRON HCL 4 MG/2ML IJ SOLN
INTRAMUSCULAR | Status: DC | PRN
  Administered 2020-01-26: 4 mg via INTRAVENOUS

## 2020-01-26 MED ORDER — FENTANYL CITRATE (PF) 100 MCG/2ML IJ SOLN
INTRAMUSCULAR | Status: DC | PRN
  Administered 2020-01-26: 25 ug via INTRAVENOUS

## 2020-01-26 MED ORDER — JUVEN PO PACK
1.0000 | PACK | Freq: Two times a day (BID) | ORAL | Status: DC
  Administered 2020-01-27 (×2): 1 via ORAL
  Filled 2020-01-26 (×2): qty 1

## 2020-01-26 MED ORDER — OXYCODONE HCL 5 MG PO TABS: 5.0000 mg | ORAL_TABLET | ORAL | Status: DC | PRN

## 2020-01-26 MED ORDER — FENTANYL CITRATE (PF) 250 MCG/5ML IJ SOLN
INTRAMUSCULAR | Status: AC
  Filled 2020-01-26: qty 5

## 2020-01-26 MED ORDER — PROMETHAZINE HCL 25 MG/ML IJ SOLN: 6.2500 mg | INTRAMUSCULAR | Status: DC | PRN

## 2020-01-26 MED ORDER — MIDAZOLAM HCL 2 MG/2ML IJ SOLN
INTRAMUSCULAR | Status: AC
  Filled 2020-01-26: qty 2

## 2020-01-26 MED ORDER — BUPIVACAINE HCL (PF) 0.25 % IJ SOLN
INTRAMUSCULAR | Status: AC
  Filled 2020-01-26: qty 30

## 2020-01-26 MED ORDER — VANCOMYCIN HCL IN DEXTROSE 1-5 GM/200ML-% IV SOLN
1000.0000 mg | Freq: Three times a day (TID) | INTRAVENOUS | Status: DC
  Administered 2020-01-26 – 2020-01-27 (×3): 1000 mg via INTRAVENOUS
  Filled 2020-01-26 (×7): qty 200

## 2020-01-26 MED ORDER — 0.9 % SODIUM CHLORIDE (POUR BTL) OPTIME
TOPICAL | Status: DC | PRN
  Administered 2020-01-26: 1000 mL

## 2020-01-26 MED ORDER — INSULIN ASPART 100 UNIT/ML ~~LOC~~ SOLN
4.0000 [IU] | Freq: Once | SUBCUTANEOUS | Status: AC
  Administered 2020-01-26: 4 [IU] via SUBCUTANEOUS

## 2020-01-26 MED ORDER — PROPOFOL 500 MG/50ML IV EMUL
INTRAVENOUS | Status: DC | PRN
  Administered 2020-01-26: 100 ug/kg/min via INTRAVENOUS

## 2020-01-26 MED ORDER — HYDROMORPHONE HCL 1 MG/ML IJ SOLN: 0.5000 mg | INTRAMUSCULAR | Status: DC | PRN

## 2020-01-26 MED ORDER — ORAL CARE MOUTH RINSE: 15.0000 mL | Freq: Once | OROMUCOSAL | Status: AC

## 2020-01-26 MED ORDER — EPHEDRINE 5 MG/ML INJ
INTRAVENOUS | Status: AC
  Filled 2020-01-26: qty 10

## 2020-01-26 MED ORDER — PHENYLEPHRINE 40 MCG/ML (10ML) SYRINGE FOR IV PUSH (FOR BLOOD PRESSURE SUPPORT)
PREFILLED_SYRINGE | INTRAVENOUS | Status: AC
  Filled 2020-01-26: qty 10

## 2020-01-26 MED ORDER — INSULIN ASPART 100 UNIT/ML ~~LOC~~ SOLN
SUBCUTANEOUS | Status: AC
  Filled 2020-01-26: qty 1

## 2020-01-26 MED ORDER — LIDOCAINE HCL 1 % IJ SOLN
INTRAMUSCULAR | Status: AC
  Filled 2020-01-26: qty 20

## 2020-01-26 MED ORDER — GLUCERNA SHAKE PO LIQD: 237.0000 mL | Freq: Every day | ORAL | Status: DC

## 2020-01-26 MED ORDER — CEFAZOLIN SODIUM-DEXTROSE 2-4 GM/100ML-% IV SOLN
2.0000 g | INTRAVENOUS | Status: AC
  Administered 2020-01-26: 2 g via INTRAVENOUS
  Filled 2020-01-26: qty 100

## 2020-01-26 MED ORDER — ADULT MULTIVITAMIN W/MINERALS CH
1.0000 | ORAL_TABLET | Freq: Every day | ORAL | Status: DC
  Administered 2020-01-27: 1 via ORAL
  Filled 2020-01-26: qty 1

## 2020-01-26 MED ORDER — CHLORHEXIDINE GLUCONATE 0.12 % MT SOLN
15.0000 mL | Freq: Once | OROMUCOSAL | Status: AC
  Administered 2020-01-26: 15 mL via OROMUCOSAL
  Filled 2020-01-26: qty 15

## 2020-01-26 MED ORDER — AMISULPRIDE (ANTIEMETIC) 5 MG/2ML IV SOLN: 10.0000 mg | Freq: Once | INTRAVENOUS | Status: DC | PRN

## 2020-01-26 MED ORDER — SODIUM CHLORIDE 0.9 % IV SOLN: INTRAVENOUS | Status: DC

## 2020-01-26 MED ORDER — LIDOCAINE HCL 1 % IJ SOLN
INTRAMUSCULAR | Status: DC | PRN
  Administered 2020-01-26: 10 mL

## 2020-01-26 MED ORDER — MIDAZOLAM HCL 5 MG/5ML IJ SOLN
INTRAMUSCULAR | Status: DC | PRN
  Administered 2020-01-26: 2 mg via INTRAVENOUS

## 2020-01-26 MED ORDER — LACTATED RINGERS IV SOLN: INTRAVENOUS | Status: DC

## 2020-01-26 SURGICAL SUPPLY — 21 items
BNDG GAUZE ELAST 4 BULKY (GAUZE/BANDAGES/DRESSINGS) ×3 IMPLANT
COVER SURGICAL LIGHT HANDLE (MISCELLANEOUS) ×3 IMPLANT
DRAPE U-SHAPE 47X51 STRL (DRAPES) ×3 IMPLANT
DRSG EMULSION OIL 3X3 NADH (GAUZE/BANDAGES/DRESSINGS) ×3 IMPLANT
DURAPREP 26ML APPLICATOR (WOUND CARE) ×3 IMPLANT
ELECT REM PT RETURN 9FT ADLT (ELECTROSURGICAL) ×3
ELECTRODE REM PT RTRN 9FT ADLT (ELECTROSURGICAL) ×1 IMPLANT
GAUZE SPONGE 4X4 12PLY STRL (GAUZE/BANDAGES/DRESSINGS) ×3 IMPLANT
GLOVE BIOGEL PI IND STRL 9 (GLOVE) ×1 IMPLANT
GLOVE BIOGEL PI INDICATOR 9 (GLOVE) ×2
GLOVE SURG ORTHO 9.0 STRL STRW (GLOVE) ×3 IMPLANT
GOWN STRL REUS W/ TWL XL LVL3 (GOWN DISPOSABLE) ×2 IMPLANT
GOWN STRL REUS W/TWL XL LVL3 (GOWN DISPOSABLE) ×4
KIT BASIN OR (CUSTOM PROCEDURE TRAY) ×3 IMPLANT
KIT TURNOVER KIT B (KITS) ×3 IMPLANT
NS IRRIG 1000ML POUR BTL (IV SOLUTION) ×3 IMPLANT
PACK ORTHO EXTREMITY (CUSTOM PROCEDURE TRAY) ×3 IMPLANT
PAD ABD 8X10 STRL (GAUZE/BANDAGES/DRESSINGS) ×3 IMPLANT
SUT ETHILON 2 0 PSLX (SUTURE) ×3 IMPLANT
SYR CONTROL 10ML LL (SYRINGE) IMPLANT
TOWEL GREEN STERILE (TOWEL DISPOSABLE) ×3 IMPLANT

## 2020-01-26 NOTE — Progress Notes (Signed)
Pharmacy Antibiotic Note  Alison Gates is a 44 y.o. female admitted on 01/24/2020 with osteo.  Pharmacy has been consulted for Vancomycin dosing.  ID: Vanc/CTX/flagyl D#2 for R toe wound (DFU) with possible osteo - Afebrile, WBC WNL, SCr <1, LA 1.7,  Sed rate and CRP elevated  CTX 10/19>> Flagy 10/19>> Vanc 10/19>> Zosyn x 1 10/19  10/20: BC x 2>>  Plan: Increase Vancomycin to 1g IV q8hr for osteo Duda consulted     Height: 5\' 1"  (154.9 cm) Weight: 81.6 kg (180 lb) (from Dec 2020 records) IBW/kg (Calculated) : 47.8  Temp (24hrs), Avg:98.7 F (37.1 C), Min:98.5 F (36.9 C), Max:98.8 F (37.1 C)  Recent Labs  Lab 01/24/20 1238 01/24/20 1240 01/25/20 0253 01/25/20 0530 01/26/20 0200  WBC 10.4  --   --  9.3 9.6  CREATININE 0.86  --   --  0.64 0.56  LATICACIDVEN  --  2.6* 1.7  --   --     Estimated Creatinine Clearance: 87.7 mL/min (by C-G formula based on SCr of 0.56 mg/dL).    No Known Allergies  Danney Bungert S. 01/28/20, PharmD, BCPS Clinical Staff Pharmacist [email or web address]  Merilynn Finland [place]illing[place] 01/26/2020 11:00 AM

## 2020-01-26 NOTE — Op Note (Signed)
01/26/2020  4:09 PM  PATIENT:  Alison Gates    PRE-OPERATIVE DIAGNOSIS:  osteomyelitis and abscess right little toe  POST-OPERATIVE DIAGNOSIS:  Same  PROCEDURE:  RIGHT FOOT fifth ray amputation. Local tissue rearrangement for wound closure 3 x 7 cm.  SURGEON:  Newt Minion, MD  PHYSICIAN ASSISTANT:None ANESTHESIA:   General  PREOPERATIVE INDICATIONS:  [name] is a  45 y.o. female with a diagnosis of osteomyelitis right little toe who failed conservative measures and elected for surgical management.    The risks benefits and alternatives were discussed with the patient preoperatively including but not limited to the risks of infection, bleeding, nerve injury, cardiopulmonary complications, the need for revision surgery, among others, and the patient was willing to proceed.  OPERATIVE IMPLANTS: None  _0 @  OPERATIVE FINDINGS: Abscess extended dorsally with ischemic tissue that required further soft tissue resection dorsally on the foot.  OPERATIVE PROCEDURE: Patient was brought the operating room underwent a MAC anesthetic.  The right lower extremity was then prepped using DuraPrep draped into a sterile field a timeout was called.  Patient underwent local anesthesia with 10 cc 1% lidocaine plain.  After adequate levels anesthesia were obtained of the little toe a racquet incision was made around the ulcer there was purulent drainage.  The abscess extended proximally on the metatarsal and the fifth metatarsal was resected through the shaft.  Further soft tissue resection was performed dorsally to achieve healthy viable margins.  This left a wound that was 3 x 7 cm.  Electrocautery was used hemostasis the wound was irrigated with normal saline.  Local tissue rearrangement was used to close the wound 3 x 7 cm.  A sterile dressing was applied patient was taken the PACU in stable condition   DISCHARGE PLANNING:  Antibiotic duration: Continue antibiotics for 24  hours  Weightbearing: Ideally nonweightbearing on the right but touchdown weightbearing is acceptable  Pain medication: Opioid pathway  Dressing care/ Wound VAC: Change dressing as needed  Ambulatory devices: Walker  Discharge to: Anticipate discharge to home  Follow-up: In the office 1 week post operative.

## 2020-01-26 NOTE — Plan of Care (Signed)
  Problem: Education: Goal: Ability to describe self-care measures that may prevent or decrease complications (Diabetes Survival Skills Education) will improve Outcome: Progressing Goal: Individualized Educational Video(s) Outcome: Progressing   Problem: Coping: Goal: Ability to adjust to condition or change in health will improve Outcome: Progressing   

## 2020-01-26 NOTE — Transfer of Care (Signed)
Immediate Anesthesia Transfer of Care Note  Patient: Alison Gates  Procedure(s) Performed: RIGHT FOOT LITTLE TOE AMPUTATION (Right )  Patient Location: PACU  Anesthesia Type:MAC  Level of Consciousness: awake and alert   Airway & Oxygen Therapy: Patient Spontanous Breathing  Post-op Assessment: Report given to RN and Post -op Vital signs reviewed and stable  Post vital signs: Reviewed and stable  Last Vitals:  Vitals Value Taken Time  BP 119/81 01/26/20 1641  Temp 36.8 C 01/26/20 1641  Pulse 78 01/26/20 1641  Resp 16 01/26/20 1641  SpO2 97 % 01/26/20 1641    Last Pain:  Vitals:   01/26/20 1641  TempSrc: Oral  PainSc: 2          Complications: No complications documented.

## 2020-01-26 NOTE — Anesthesia Preprocedure Evaluation (Addendum)
Anesthesia Evaluation  Patient identified by MRN, date of birth, ID band Patient awake    Airway Mallampati: II  TM Distance: >3 FB Neck ROM: Full    Dental no notable dental hx.    Pulmonary neg pulmonary ROS,    Pulmonary exam normal breath sounds clear to auscultation       Cardiovascular Exercise Tolerance: Good negative cardio ROS Normal cardiovascular exam Rhythm:Regular Rate:Normal     Neuro/Psych negative neurological ROS  negative psych ROS   GI/Hepatic negative GI ROS, Neg liver ROS,   Endo/Other  diabetes, Poorly Controlled, Type 2Obesity BMI: 34  Renal/GU negative Renal ROS  negative genitourinary   Musculoskeletal negative musculoskeletal ROS (+)   Abdominal Normal abdominal exam  (+)   Peds negative pediatric ROS (+)  Hematology negative hematology ROS (+)   Anesthesia Other Findings   Reproductive/Obstetrics negative OB ROS                            Anesthesia Physical Anesthesia Plan  ASA: III  Anesthesia Plan: MAC   Post-op Pain Management:    Induction:   PONV Risk Score and Plan: 2 and Propofol infusion, TIVA, Treatment may vary due to age or medical condition, Midazolam and Ondansetron  Airway Management Planned: Natural Airway and Simple Face Mask  Additional Equipment:   Intra-op Plan:   Post-operative Plan:   Informed Consent: I have reviewed the patients History and Physical, chart, labs and discussed the procedure including the risks, benefits and alternatives for the proposed anesthesia with the patient or authorized representative who has indicated his/her understanding and acceptance.     Dental advisory given and Interpreter used for interveiw  Plan Discussed with: CRNA and Anesthesiologist  Anesthesia Plan Comments: (MAC with Propofol gtt. Local by surgeon. GA/LMA as backup plan.)        Anesthesia Quick Evaluation

## 2020-01-26 NOTE — Anesthesia Procedure Notes (Signed)
Procedure Name: MAC Date/Time: 01/26/2020 3:30 PM Performed by: Inda Coke, CRNA Pre-anesthesia Checklist: Patient identified, Emergency Drugs available, Suction available, Timeout performed and Patient being monitored Patient Re-evaluated:Patient Re-evaluated prior to induction Oxygen Delivery Method: Simple face mask Induction Type: IV induction Dental Injury: Teeth and Oropharynx as per pre-operative assessment

## 2020-01-26 NOTE — Progress Notes (Signed)
Initial Nutrition Assessment  DOCUMENTATION CODES:   Obesity unspecified  INTERVENTION:   -1 packet Juven BID, each packet provides 95 calories, 2.5 grams of protein (collagen), and 9.8 grams of carbohydrate (3 grams sugar); also contains 7 grams of L-arginine and L-glutamine, 300 mg vitamin C, 15 mg vitamin E, 1.2 mcg vitamin B-12, 9.5 mg zinc, 200 mg calcium, and 1.5 g  Calcium Beta-hydroxy-Beta-methylbutyrate to support wound healing -Glucerna Shake po daILY, each supplement provides 220 kcal and 10 grams of protein -MVI with minerals daily  NUTRITION DIAGNOSIS:   Increased nutrient needs related to wound healing as evidenced by estimated needs.  GOAL:   Patient will meet greater than or equal to 90% of their needs  MONITOR:   PO intake, Supplement acceptance, Labs, Weight trends, Skin, I & O's  REASON FOR ASSESSMENT:   Consult Wound healing  ASSESSMENT:   Alison Gates is a 44 y.o. female with medical history significant for poorly controlled type 2 diabetes mellitus, now presenting to the emergency department with a right fifth toe wound and progressive surrounding erythema and swelling.  Pt admitted with rt foot cellulitis.   Reviewed I/O's:+240 ml x 24 hours  Noted pt awaiting orthopedics consult.   Pt sitting up in bed, talking on phone at time of visit.   Pt with fair appetite; noted meal completion 50%.   No wt hx available to assess wt changes.   Lab Results  Component Value Date   HGBA1C 11.0 (H) 01/25/2020   PTA DM medications are none.   Labs reviewed: CBGS: 248-320 (inpatient orders for glycemic control are 0-5 units insulin aspart daily at bedtime, 0-9 units insulin aspart TID with meals,and 10 units insulin glargine daily).   NUTRITION - FOCUSED PHYSICAL EXAM:    Most Recent Value  Orbital Region No depletion  Upper Arm Region No depletion  Thoracic and Lumbar Region No depletion  Buccal Region No depletion  Temple Region No depletion   Clavicle Bone Region No depletion  Clavicle and Acromion Bone Region No depletion  Scapular Bone Region No depletion  Dorsal Hand No depletion  Patellar Region No depletion  Anterior Thigh Region No depletion  Posterior Calf Region No depletion  Edema (RD Assessment) None  Hair Reviewed  Eyes Reviewed  Mouth Reviewed  Skin Reviewed  Nails Reviewed       Diet Order:   Diet Order            Diet Carb Modified Fluid consistency: Thin; Room service appropriate? Yes  Diet effective now                 EDUCATION NEEDS:   No education needs have been identified at this time  Skin:     Last BM:     Height:   Ht Readings from Last 1 Encounters:  01/25/20 5\' 1"  (1.549 m)    Weight:   Wt Readings from Last 1 Encounters:  01/25/20 81.6 kg    Ideal Body Weight:  47.7 kg  BMI:  Body mass index is 34.01 kg/m.  Estimated Nutritional Needs:   Kcal:  1700-1900  Protein:  95-110 grams  Fluid:  > 1.7 L    01/27/20, RD, LDN, CDCES Registered Dietitian II Certified Diabetes Care and Education Specialist Please refer to [facility] for RD and/or RD on-call/weekend/after hours pager

## 2020-01-26 NOTE — Progress Notes (Signed)
Orthopedic Tech Progress Note Patient Details:  Alison Gates 03/03/1976 [id number]  Ortho Devices Type of Ortho Device: Postop shoe/boot Ortho Device/Splint Location: RLE Ortho Device/Splint Interventions: Application   Post Interventions Patient Tolerated: Well Instructions Provided: Care of device   Donald Pore 01/26/2020, 5:52 PM

## 2020-01-26 NOTE — Progress Notes (Signed)
PROGRESS NOTE    Alison Gates  ZOX:[id number] DOB: [birth year] DOA: 01/24/2020 PCP: Patient, No Pcp Per    Brief Narrative:  44 year old Spanish-speaking female with poorly controlled type 2 diabetes, not on treatment presented to the ER with right fifth toe wound and progressive surrounding erythema and swelling.  Patient stated ongoing symptoms for about 2 weeks. In the emergency room, afebrile.  On room air.  Stable blood pressures.  Blood glucose 491.  Lactic acid 2.6. MRI with osteomyelitis.  Admitted for surgical intervention.  Assessment & Plan:   Principal Problem:   Right foot infection Active Problems:   Diabetes mellitus type II, uncontrolled (HCC)  Right foot cellulitis with toe osteomyelitis: On broad-spectrum antibiotics.  Blood cultures negative so far.  Surgical cultures will be appreciated. Seen by surgery, possible surgical resection today. Postop management/surgical management as per surgery. We will continue IV antibiotics until surgical resection to clean margin.  Type 2 diabetes, uncontrolled with hyperglycemia: Patient not using any diabetes medicine at home. A1c is 11. Started on SSI and long-acting insulin. We will continue to discuss with patient about going home with insulin and oral hypoglycemic agent.   DVT prophylaxis: enoxaparin (LOVENOX) injection 40 mg Start: 01/25/20 0315   Code Status: Full code Family Communication: None Disposition Plan: Status is: Inpatient  Remains inpatient appropriate because:Inpatient level of care appropriate due to severity of illness   Dispo: The patient is from: Home              Anticipated d/c is to: Home              Anticipated d/c date is: 2 days              Patient currently is not medically stable to d/c.         Consultants:   Orthopedics  Procedures:   None  Antimicrobials:  Anti-infectives (From admission, onward)   Start     Dose/Rate Route Frequency Ordered Stop   01/27/20  0600  ceFAZolin (ANCEF) IVPB 2g/100 mL premix        2 g 200 mL/hr over 30 Minutes Intravenous On call to O.R. 01/26/20 1322 01/28/20 0559   01/26/20 1256  [MAR Hold]  vancomycin (VANCOCIN) IVPB 1000 mg/200 mL premix        (MAR Hold since Wed 01/26/2020 at 1330.Hold Reason: Transfer to a Procedural area.)   1,000 mg 200 mL/hr over 60 Minutes Intravenous Every 8 hours 01/26/20 1059     01/25/20 1600  vancomycin (VANCOCIN) IVPB 1000 mg/200 mL premix  Status:  Discontinued        1,000 mg 200 mL/hr over 60 Minutes Intravenous Every 12 hours 01/25/20 0321 01/26/20 1059   01/25/20 0315  [MAR Hold]  cefTRIAXone (ROCEPHIN) 2 g in sodium chloride 0.9 % 100 mL IVPB        (MAR Hold since Wed 01/26/2020 at 1330.Hold Reason: Transfer to a Procedural area.)   2 g 200 mL/hr over 30 Minutes Intravenous Every 24 hours 01/25/20 0313     01/25/20 0315  [MAR Hold]  metroNIDAZOLE (FLAGYL) IVPB 500 mg        (MAR Hold since Wed 01/26/2020 at 1330.Hold Reason: Transfer to a Procedural area.)   500 mg 100 mL/hr over 60 Minutes Intravenous Every 8 hours 01/25/20 0313     01/25/20 0300  Vancomycin (VANCOCIN) 1,500 mg in sodium chloride 0.9 % 500 mL IVPB        1,500 mg 250 mL/hr  over 120 Minutes Intravenous  Once 01/25/20 0251 01/25/20 0756   01/25/20 0300  piperacillin-tazobactam (ZOSYN) IVPB 3.375 g        3.375 g 100 mL/hr over 30 Minutes Intravenous  Once 01/25/20 0251 01/25/20 8144         Subjective: Patient seen and examined.  I used video interpreter service to talk to her.  Patient without any complaints.  Has some pain on the right foot.  Apparently patient tells me that she has never taken diabetes medicine. She was told she is prediabetic and not diabetic.  Looks like her A1c was 12.6,1-year ago.  Objective: Vitals:   01/25/20 2111 01/26/20 0011 01/26/20 0300 01/26/20 0747  BP: 107/61 96/62 108/60 114/87  Pulse: 92 80 78 84  Resp: 15 17 17 17   Temp: 98.8 F (37.1 C) 98.8 F (37.1 C) 98.7  F (37.1 C) 98.8 F (37.1 C)  TempSrc: Oral Oral Oral Oral  SpO2: 100% 99% 100% 97%  Weight:      Height:        Intake/Output Summary (Last 24 hours) at 01/26/2020 1354 Last data filed at 01/26/2020 0430 Gross per 24 hour  Intake 240 ml  Output --  Net 240 ml   Filed Weights   01/25/20 0241  Weight: 81.6 kg    Examination:  General exam: Appears calm and comfortable  Respiratory system: Clear to auscultation. Respiratory effort normal. Cardiovascular system: S1 & S2 heard, RRR. No JVD, murmurs, rubs, gallops or clicks. No pedal edema. Gastrointestinal system: Abdomen is nondistended, soft and nontender. No organomegaly or masses felt. Normal bowel sounds heard. Central nervous system: Alert and oriented. No focal neurological deficits. Extremities: Symmetric 5 x 5 power.  Right foot on surgical dressing.  Erythema and induration on the right foot and ankle.   Data Reviewed: I have personally reviewed following labs and imaging studies  CBC: Recent Labs  Lab 01/24/20 1238 01/25/20 0530 01/26/20 0200  WBC 10.4 9.3 9.6  NEUTROABS  --  6.1 5.7  HGB 14.1 12.2 12.4  HCT 41.0 36.3 36.4  MCV 91.5 91.4 89.7  PLT 272 252 271   Basic Metabolic Panel: Recent Labs  Lab 01/24/20 1238 01/25/20 0530 01/26/20 0200  NA 132* 135 135  K 4.1 3.8 3.7  CL 97* 100 100  CO2 23 24 26   GLUCOSE 491* 315* 245*  BUN 9 13 10   CREATININE 0.86 0.64 0.56  CALCIUM 9.1 8.5* 9.1   GFR: Estimated Creatinine Clearance: 87.7 mL/min (by C-G formula based on SCr of 0.56 mg/dL). Liver Function Tests: Recent Labs  Lab 01/24/20 1238  AST 18  ALT 22  ALKPHOS 145*  BILITOT 0.5  PROT 7.4  ALBUMIN 3.3*   No results for input(s): LIPASE, AMYLASE in the last 168 hours. No results for input(s): AMMONIA in the last 168 hours. Coagulation Profile: No results for input(s): INR, PROTIME in the last 168 hours. Cardiac Enzymes: No results for input(s): CKTOTAL, CKMB, CKMBINDEX, TROPONINI in the  last 168 hours. BNP (last 3 results) No results for input(s): PROBNP in the last 8760 hours. HbA1C: Recent Labs    01/25/20 0530  HGBA1C 11.0*   CBG: Recent Labs  Lab 01/25/20 1722 01/25/20 1835 01/25/20 2159 01/26/20 0638 01/26/20 1209  GLUCAP 297* 248* 320* 267* 193*   Lipid Profile: No results for input(s): CHOL, HDL, LDLCALC, TRIG, CHOLHDL, LDLDIRECT in the last 72 hours. Thyroid Function Tests: No results for input(s): TSH, T4TOTAL, FREET4, T3FREE, THYROIDAB in the  last 72 hours. Anemia Panel: No results for input(s): VITAMINB12, FOLATE, FERRITIN, TIBC, IRON, RETICCTPCT in the last 72 hours. Sepsis Labs: Recent Labs  Lab 01/24/20 1240 01/25/20 0253  LATICACIDVEN 2.6* 1.7    Recent Results (from the past 240 hour(s))  Blood culture (routine x 2)     Status: None (Preliminary result)   Collection Time: 01/24/20 12:48 PM   Specimen: BLOOD  Result Value Ref Range Status   Specimen Description BLOOD RIGHT ANTECUBITAL  Final   Special Requests   Final    BOTTLES DRAWN AEROBIC AND ANAEROBIC Blood Culture adequate volume   Culture   Final    NO GROWTH 2 DAYS Performed at [facility], [address] [place], [place] [address]    Report Status PENDING  Incomplete  Blood culture (routine x 2)     Status: None (Preliminary result)   Collection Time: 01/25/20  3:00 AM   Specimen: BLOOD  Result Value Ref Range Status   Specimen Description BLOOD LEFT ANTECUBITAL  Final   Special Requests   Final    BOTTLES DRAWN AEROBIC AND ANAEROBIC Blood Culture adequate volume   Culture   Final    NO GROWTH 1 DAY Performed at [facility], [address] [place], [place] [address]    Report Status PENDING  Incomplete  Respiratory Panel by RT PCR (Flu A&B, Covid) - Nasopharyngeal Swab     Status: None   Collection Time: 01/25/20  3:00 AM   Specimen: Nasopharyngeal Swab  Result Value Ref Range Status   SARS Coronavirus 2 by RT PCR NEGATIVE NEGATIVE Final     Comment: (NOTE) SARS-CoV-2 target nucleic acids are NOT DETECTED.  The SARS-CoV-2 RNA is generally detectable in upper respiratoy specimens during the acute phase of infection. The lowest concentration of SARS-CoV-2 viral copies this assay can detect is 131 copies/mL. A negative result does not preclude SARS-Cov-2 infection and should not be used as the sole basis for treatment or other patient management decisions. A negative result may occur with  improper specimen collection/handling, submission of specimen other than nasopharyngeal swab, presence of viral mutation(s) within the areas targeted by this assay, and inadequate number of viral copies (<131 copies/mL). A negative result must be combined with clinical observations, patient history, and epidemiological information. The expected result is Negative.  Fact Sheet for Patients:  https://www.moore.com/https://www.fda.gov/media/142436/download  Fact Sheet for Healthcare Providers:  https://www.young.biz/https://www.fda.gov/media/142435/download  This test is no t yet approved or cleared by the [place] FDA and  has been authorized for detection and/or diagnosis of SARS-CoV-2 by FDA under an Emergency Use Authorization (EUA). This EUA will remain  in effect (meaning this test can be used) for the duration of the COVID-19 declaration under Section 564(b)(1) of the Act, 21 U.S.C. section 360bbb-3(b)(1), unless the authorization is terminated or revoked sooner.     Influenza A by PCR NEGATIVE NEGATIVE Final   Influenza B by PCR NEGATIVE NEGATIVE Final    Comment: (NOTE) The Xpert Xpress SARS-CoV-2/FLU/RSV assay is intended as an aid in  the diagnosis of influenza from Nasopharyngeal swab specimens and  should not be used as a sole basis for treatment. Nasal washings and  aspirates are unacceptable for Xpert Xpress SARS-CoV-2/FLU/RSV  testing.  Fact Sheet for Patients: https://www.moore.com/https://www.fda.gov/media/142436/download  Fact Sheet for Healthcare  Providers: https://www.young.biz/https://www.fda.gov/media/142435/download  This test is not yet approved or cleared by the [place] FDA and  has been authorized for detection and/or diagnosis of SARS-CoV-2 by  FDA under an Emergency Use Authorization (EUA). This EUA will remain  in effect (meaning this test can be used) for the duration of the  Covid-19 declaration under Section 564(b)(1) of the Act, 21  U.S.C. section 360bbb-3(b)(1), unless the authorization is  terminated or revoked. Performed at [facility], [address], [place], [place] [address]   Surgical PCR screen     Status: None   Collection Time: 01/26/20  9:36 AM   Specimen: Nasal Mucosa; Nasal Swab  Result Value Ref Range Status   MRSA, PCR NEGATIVE NEGATIVE Final   Staphylococcus aureus NEGATIVE NEGATIVE Final    Comment: (NOTE) The Xpert SA Assay (FDA approved for NASAL specimens in patients 46 years of age and older), is one component of a comprehensive surveillance program. It is not intended to diagnose infection nor to guide or monitor treatment. Performed at [facility], [address] [place], [place] [address]          Radiology Studies: MRI Right foot with and without contrast  Result Date: 01/25/2020 CLINICAL DATA:  Diabetic foot wound to the right fifth toe EXAM: MRI OF THE RIGHT FOREFOOT WITHOUT AND WITH CONTRAST TECHNIQUE: Multiplanar, multisequence MR imaging of the right forefoot was performed before and after the administration of intravenous contrast. CONTRAST:  69mL GADAVIST GADOBUTROL 1 MMOL/ML IV SOLN COMPARISON:  X-ray 01/24/2020 FINDINGS: Bones/Joint/Cartilage Bone marrow edema throughout the right fifth toe distal phalanx. Subtle low T1 signal at the level of the distal tuft along the dorsal margin (series 4, image 12) suspicious for a site of osteomyelitis. Preservation of the bone marrow signal within the proximal phalanx of the fifth toe. The remaining included osseous structures of the  forefoot are intact with normal signal. No acute fracture. No dislocation. Minimal degenerative changes of the first MTP joint. No joint effusion. Ligaments Intact Lisfranc ligament. Collateral ligaments of forefoot appear intact. Intact plantar plates. Muscles and Tendons Muscle bulk and signal intensity is within normal limits. Intact flexor and extensor tendons. No appreciable tenosynovial fluid collection. Soft tissues Soft tissue edema and enhancement of the fifth toe with superficial skin ulceration along the dorsal surface. Subcutaneous edema overlies the dorsum of the forefoot. No organized soft tissue fluid collection. IMPRESSION: Superficial skin ulceration of the right fifth toe with surrounding cellulitis. Bone marrow edema within the distal phalanx of the right fifth toe with subtle T1 signal changes of the distal tuft suspicious for a site of osteomyelitis. These results will be called to the ordering clinician or representative by the Radiologist Assistant, and communication documented in the PACS or [employer]. Electronically Signed   By: Duanne Guess D.O.   On: 01/25/2020 08:14   VAS [place] ABI WITH/WO TBI  Result Date: 01/25/2020 LOWER EXTREMITY DOPPLER STUDY Indications: Ulceration, peripheral artery disease, and Ulceration Right 5th              toe. High Risk Factors: Diabetes.  Performing Technologist: Jannet Askew RCT RDMS  Examination Guidelines: A complete evaluation includes at minimum, Doppler waveform signals and systolic blood pressure reading at the level of bilateral brachial, anterior tibial, and posterior tibial arteries, when vessel segments are accessible. Bilateral testing is considered an integral part of a complete examination. Photoelectric Plethysmograph (PPG) waveforms and toe systolic pressure readings are included as required and additional duplex testing as needed. Limited examinations for reoccurring indications may be performed as noted.  ABI Findings:  +---------+------------------+-----+---------+--------+ Right    Rt Pressure (mmHg)IndexWaveform Comment  +---------+------------------+-----+---------+--------+  Brachial 140                    triphasic         +---------+------------------+-----+---------+--------+ PTA      124               0.89 triphasic         +---------+------------------+-----+---------+--------+ DP       123               0.88 triphasic         +---------+------------------+-----+---------+--------+ Great Toe125               0.89 Normal            +---------+------------------+-----+---------+--------+ +---------+------------------+-----+---------+-------+ Left     Lt Pressure (mmHg)IndexWaveform Comment +---------+------------------+-----+---------+-------+ Brachial 128                    triphasic        +---------+------------------+-----+---------+-------+ PTA      145               1.04 triphasic        +---------+------------------+-----+---------+-------+ DP       141               1.01 triphasic        +---------+------------------+-----+---------+-------+ Great Toe119               0.85 Normal           +---------+------------------+-----+---------+-------+ TOES Findings: +----------+---------------+--------+-------+ Right ToesPressure (mmHg)WaveformComment +----------+---------------+--------+-------+ 1st Digit 125            Normal          +----------+---------------+--------+-------+  +---------+---------------+--------+-------+ Left ToesPressure (mmHg)WaveformComment +---------+---------------+--------+-------+ 1st [id number]            Normal          +---------+---------------+--------+-------+    Summary: Right: Resting right ankle-brachial index indicates mild right lower extremity arterial disease. Left: Resting left ankle-brachial index is within normal range. No evidence of significant left lower extremity arterial disease.  *See table(s)  above for measurements and observations.  Electronically signed by Gretta Began MD on 01/25/2020 at 4:20:46 PM.    Final         Scheduled Meds: . chlorhexidine  15 mL Mouth/Throat Once   Or  . mouth rinse  15 mL Mouth Rinse Once  . [MAR Hold] enoxaparin (LOVENOX) injection  40 mg Subcutaneous Q24H  . [MAR Hold] insulin aspart  0-5 Units Subcutaneous QHS  . [MAR Hold] insulin aspart  0-9 Units Subcutaneous TID WC  . [MAR Hold] insulin glargine  10 Units Subcutaneous Daily  . [MAR Hold] mupirocin ointment  1 application Nasal BID   Continuous Infusions: . [START ON 01/27/2020]  ceFAZolin (ANCEF) IV    . [MAR Hold] cefTRIAXone (ROCEPHIN)  IV 2 g (01/26/20 0351)  . lactated ringers    . [MAR Hold] metronidazole 500 mg (01/25/20 2316)  . [MAR Hold] vancomycin 1,000 mg (01/26/20 1246)     LOS: 1 day    Time spent: 30 minutes    Dorcas Carrow, MD [facility] Pager [phone]

## 2020-01-26 NOTE — Consult Note (Signed)
ORTHOPAEDIC CONSULTATION  REQUESTING PHYSICIAN: Dorcas Carrow, MD  Chief Complaint: Osteomyelitis ulceration right foot fifth toe.  HPI: Alison Gates is a 44 y.o. female who presents with type 2 diabetes with ulceration and osteomyelitis right foot little toe  Past Medical History:  Diagnosis Date  . Diabetes (HCC) 10/2018   History reviewed. No pertinent surgical history. Social History   Socioeconomic History  . Marital status: Single    Spouse name: Not on file  . Number of children: Not on file  . Years of education: Not on file  . Highest education level: Not on file  Occupational History  . Not on file  Tobacco Use  . Smoking status: Never Smoker  . Smokeless tobacco: Never Used  Substance and Sexual Activity  . Alcohol use: Not Currently  . Drug use: Not Currently  . Sexual activity: Not Currently  Other Topics Concern  . Not on file  Social History Narrative  . Not on file   Social Determinants of Health   Financial Resource Strain:   . Difficulty of Paying Living Expenses: Not on file  Food Insecurity:   . Worried About Programme researcher, broadcasting/film/video in the Last Year: Not on file  . Ran Out of Food in the Last Year: Not on file  Transportation Needs:   . Lack of Transportation (Medical): Not on file  . Lack of Transportation (Non-Medical): Not on file  Physical Activity:   . Days of Exercise per Week: Not on file  . Minutes of Exercise per Session: Not on file  Stress:   . Feeling of Stress : Not on file  Social Connections:   . Frequency of Communication with Friends and Family: Not on file  . Frequency of Social Gatherings with Friends and Family: Not on file  . Attends Religious Services: Not on file  . Active Member of Clubs or Organizations: Not on file  . Attends Banker Meetings: Not on file  . Marital Status: Not on file   History reviewed. No pertinent family history. - negative except otherwise stated in the family history  section No Known Allergies Prior to Admission medications   Medication Sig Start Date End Date Taking? Authorizing Provider  ibuprofen (ADVIL) 200 MG tablet Take 400 mg by mouth every 6 (six) hours as needed for fever, headache or mild pain.   Yes [provider]  vitamin B-12 (CYANOCOBALAMIN) 500 MCG tablet Take 500 mcg by mouth daily.   Yes [provider]   MRI Right foot with and without contrast  Result Date: 01/25/2020 CLINICAL DATA:  Diabetic foot wound to the right fifth toe EXAM: MRI OF THE RIGHT FOREFOOT WITHOUT AND WITH CONTRAST TECHNIQUE: Multiplanar, multisequence MR imaging of the right forefoot was performed before and after the administration of intravenous contrast. CONTRAST:  53mL GADAVIST GADOBUTROL 1 MMOL/ML IV SOLN COMPARISON:  X-ray 01/24/2020 FINDINGS: Bones/Joint/Cartilage Bone marrow edema throughout the right fifth toe distal phalanx. Subtle low T1 signal at the level of the distal tuft along the dorsal margin (series 4, image 12) suspicious for a site of osteomyelitis. Preservation of the bone marrow signal within the proximal phalanx of the fifth toe. The remaining included osseous structures of the forefoot are intact with normal signal. No acute fracture. No dislocation. Minimal degenerative changes of the first MTP joint. No joint effusion. Ligaments Intact Lisfranc ligament. Collateral ligaments of forefoot appear intact. Intact plantar plates. Muscles and Tendons Muscle bulk and signal intensity is within normal  limits. Intact flexor and extensor tendons. No appreciable tenosynovial fluid collection. Soft tissues Soft tissue edema and enhancement of the fifth toe with superficial skin ulceration along the dorsal surface. Subcutaneous edema overlies the dorsum of the forefoot. No organized soft tissue fluid collection. IMPRESSION: Superficial skin ulceration of the right fifth toe with surrounding cellulitis. Bone marrow edema within the distal phalanx of  the right fifth toe with subtle T1 signal changes of the distal tuft suspicious for a site of osteomyelitis. These results will be called to the ordering clinician or representative by the Radiologist Assistant, and communication documented in the PACS or [employer]. Electronically Signed   By: Duanne Guess D.O.   On: 01/25/2020 08:14   VAS [place] ABI WITH/WO TBI  Result Date: 01/25/2020 LOWER EXTREMITY DOPPLER STUDY Indications: Ulceration, peripheral artery disease, and Ulceration Right 5th              toe. High Risk Factors: Diabetes.  Performing Technologist: Jannet Askew RCT RDMS  Examination Guidelines: A complete evaluation includes at minimum, Doppler waveform signals and systolic blood pressure reading at the level of bilateral brachial, anterior tibial, and posterior tibial arteries, when vessel segments are accessible. Bilateral testing is considered an integral part of a complete examination. Photoelectric Plethysmograph (PPG) waveforms and toe systolic pressure readings are included as required and additional duplex testing as needed. Limited examinations for reoccurring indications may be performed as noted.  ABI Findings: +---------+------------------+-----+---------+--------+ Right    Rt Pressure (mmHg)IndexWaveform Comment  +---------+------------------+-----+---------+--------+ Brachial 140                    triphasic         +---------+------------------+-----+---------+--------+ PTA      124               0.89 triphasic         +---------+------------------+-----+---------+--------+ DP       123               0.88 triphasic         +---------+------------------+-----+---------+--------+ Great Toe125               0.89 Normal            +---------+------------------+-----+---------+--------+ +---------+------------------+-----+---------+-------+ Left     Lt Pressure (mmHg)IndexWaveform Comment  +---------+------------------+-----+---------+-------+ Brachial 128                    triphasic        +---------+------------------+-----+---------+-------+ PTA      145               1.04 triphasic        +---------+------------------+-----+---------+-------+ DP       141               1.01 triphasic        +---------+------------------+-----+---------+-------+ Great Toe119               0.85 Normal           +---------+------------------+-----+---------+-------+ TOES Findings: +----------+---------------+--------+-------+ Right ToesPressure (mmHg)WaveformComment +----------+---------------+--------+-------+ 1st Digit 125            Normal          +----------+---------------+--------+-------+  +---------+---------------+--------+-------+ Left ToesPressure (mmHg)WaveformComment +---------+---------------+--------+-------+ 1st [id number]            Normal          +---------+---------------+--------+-------+    Summary: Right: Resting right ankle-brachial index indicates mild right lower extremity arterial disease. Left: Resting left  ankle-brachial index is within normal range. No evidence of significant left lower extremity arterial disease.  *See table(s) above for measurements and observations.  Electronically signed by Gretta Began MD on 01/25/2020 at 4:20:46 PM.    Final    - pertinent xrays, CT, MRI studies were reviewed and independently interpreted  Positive ROS: All other systems have been reviewed and were otherwise negative with the exception of those mentioned in the HPI and as above.  Physical Exam: General: Alert, no acute distress Psychiatric: Patient is competent for consent with normal mood and affect Lymphatic: No axillary or cervical lymphadenopathy Cardiovascular: No pedal edema Respiratory: No cyanosis, no use of accessory musculature GI: No organomegaly, abdomen is soft and non-tender    Images:  @ENCIMAGES @  Labs:  Lab Results   Component Value Date   HGBA1C 11.0 (H) 01/25/2020   ESRSEDRATE 96 (H) 01/26/2020   ESRSEDRATE 87 (H) 01/25/2020   CRP 8.6 (H) 01/26/2020   CRP 10.3 (H) 01/25/2020   REPTSTATUS PENDING 01/25/2020   CULT  01/25/2020    NO GROWTH 1 DAY Performed at [facility], [address] [place], [place]     Lab Results  Component Value Date   ALBUMIN 3.3 (L) 01/24/2020   PREALBUMIN 12.1 (L) 01/25/2020    Neurologic: Patient does not have protective sensation bilateral lower extremities.   MUSCULOSKELETAL:   Skin: On examination patient has ulceration and cellulitis right foot little toe.  She has a palpable pulse ankle-brachial indices show triphasic circulation at the ankle.  MRI scan shows edema of the tuft of the little toe consistent with osteomyelitis. 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  Plan: Plan: We will plan for right fifth toe amputation risks and benefits were discussed the interpreter was used on line all questions were encouraged and answered patient states she wished to proceed with surgery.  Thank you for the consult and the opportunity to see Ms. 01/27/2020, MD Barb Merino [phone] 3:22 PM

## 2020-01-27 ENCOUNTER — Encounter (HOSPITAL_COMMUNITY): Payer: Self-pay | Admitting: Orthopedic Surgery

## 2020-01-27 ENCOUNTER — Other Ambulatory Visit (HOSPITAL_COMMUNITY): Payer: Self-pay | Admitting: Internal Medicine

## 2020-01-27 LAB — CBC WITH DIFFERENTIAL/PLATELET
Abs Immature Granulocytes: 0.03 10*3/uL (ref 0.00–0.07)
Basophils Absolute: 0 10*3/uL (ref 0.0–0.1)
Basophils Relative: 0 %
Eosinophils Absolute: 0.1 10*3/uL (ref 0.0–0.5)
Eosinophils Relative: 1 %
HCT: 34.7 % — ABNORMAL LOW (ref 36.0–46.0)
Hemoglobin: 11.9 g/dL — ABNORMAL LOW (ref 12.0–15.0)
Immature Granulocytes: 0 %
Lymphocytes Relative: 20 %
Lymphs Abs: 1.7 10*3/uL (ref 0.7–4.0)
MCH: 31.3 pg (ref 26.0–34.0)
MCHC: 34.3 g/dL (ref 30.0–36.0)
MCV: 91.3 fL (ref 80.0–100.0)
Monocytes Absolute: 0.7 10*3/uL (ref 0.1–1.0)
Monocytes Relative: 9 %
Neutro Abs: 6.1 10*3/uL (ref 1.7–7.7)
Neutrophils Relative %: 70 %
Platelets: 285 10*3/uL (ref 150–400)
RBC: 3.8 MIL/uL — ABNORMAL LOW (ref 3.87–5.11)
RDW: 12.2 % (ref 11.5–15.5)
WBC: 8.7 10*3/uL (ref 4.0–10.5)
nRBC: 0 % (ref 0.0–0.2)

## 2020-01-27 LAB — BASIC METABOLIC PANEL
Anion gap: 9 (ref 5–15)
BUN: 8 mg/dL (ref 6–20)
CO2: 25 mmol/L (ref 22–32)
Calcium: 8.4 mg/dL — ABNORMAL LOW (ref 8.9–10.3)
Chloride: 99 mmol/L (ref 98–111)
Creatinine, Ser: 0.57 mg/dL (ref 0.44–1.00)
GFR, Estimated: >60 mL/min (ref >60)
Glucose, Bld: 276 mg/dL — ABNORMAL HIGH (ref 70–99)
Potassium: 3.8 mmol/L (ref 3.5–5.1)
Sodium: 133 mmol/L — ABNORMAL LOW (ref 135–145)

## 2020-01-27 LAB — GLUCOSE, CAPILLARY
Glucose-Capillary: 268 mg/dL — ABNORMAL HIGH (ref 70–99)
Glucose-Capillary: 291 mg/dL — ABNORMAL HIGH (ref 70–99)

## 2020-01-27 LAB — C-REACTIVE PROTEIN: CRP: 4.9 mg/dL — ABNORMAL HIGH (ref <1.0)

## 2020-01-27 LAB — SEDIMENTATION RATE: Sed Rate: 86 mm/hr — ABNORMAL HIGH (ref 0–22)

## 2020-01-27 MED ORDER — METFORMIN HCL 500 MG PO TABS: 500.0000 mg | ORAL_TABLET | Freq: Two times a day (BID) | ORAL | 11 refills | Status: DC

## 2020-01-27 MED ORDER — CIPROFLOXACIN HCL 500 MG PO TABS: 500.0000 mg | ORAL_TABLET | Freq: Two times a day (BID) | ORAL | 0 refills | Status: DC

## 2020-01-27 MED ORDER — BLOOD GLUCOSE MONITORING SUPPL DEVI: 1.0000 | Freq: Three times a day (TID) | 0 refills | Status: AC

## 2020-01-27 MED ORDER — GLIPIZIDE 5 MG PO TABS: 5.0000 mg | ORAL_TABLET | Freq: Two times a day (BID) | ORAL | 11 refills | Status: DC

## 2020-01-27 MED FILL — glipiZIDE 5 MG TABS: 5 | 30 days supply | Qty: 60 | Fill #0

## 2020-01-27 MED FILL — TRUE METRIX GLUCOSE TEST ST: 30 days supply | Qty: 100 | Fill #0

## 2020-01-27 MED FILL — metFORMIN HCL 500 MG TABS: 500 | 30 days supply | Qty: 60 | Fill #0

## 2020-01-27 MED FILL — TRUEplus LANCETS 28G MISC: 30 days supply | Qty: 100 | Fill #0

## 2020-01-27 MED FILL — TRUE METRIX BLOOD GLUCOSE M: W/DEVICE | 1 days supply | Qty: 1 | Fill #0

## 2020-01-27 MED FILL — CIPROFLOXACIN HCL 500 MG TA: 500 | 7 days supply | Qty: 14 | Fill #0

## 2020-01-27 NOTE — Plan of Care (Signed)
  Problem: Education: Goal: Ability to describe self-care measures that may prevent or decrease complications (Diabetes Survival Skills Education) will improve Outcome: Progressing Goal: Individualized Educational Video(s) Outcome: Progressing   Problem: Coping: Goal: Ability to adjust to condition or change in health will improve Outcome: Progressing   Problem: Nutrition: Goal: Adequate nutrition will be maintained Outcome: Progressing   Problem: Elimination: Goal: Will not experience complications related to bowel motility Outcome: Progressing Goal: Will not experience complications related to urinary retention Outcome: Progressing   Problem: Activity: Goal: Risk for activity intolerance will decrease Outcome: Progressing

## 2020-01-27 NOTE — Discharge Summary (Signed)
Physician Discharge Summary  Mende Biswell TFT:[id number] DOB: May 02, 1975 DOA: 01/24/2020  PCP: Patient, No Pcp Per  Admit date: 01/24/2020 Discharge date: 01/27/2020  Admitted From: Home Disposition: Home  Recommendations for Outpatient Follow-up:  1. Follow up with PCP in 1-2 weeks 2. Follow-up with orthopedic surgery as scheduled.  Home Health: Not applicable Equipment/Devices: 3 and 1  Discharge Condition: Stable CODE STATUS: Full code Diet recommendation: Low-carb diet  Discharge summary: 44 year old Spanish-speaking female with poorly controlled type 2 diabetes not on treatment thinking it was prediabetes presented to the ER with right fifth toe wound and progressive surrounding erythema and swelling.  She had ongoing symptoms for 2 weeks.  In the emergency room afebrile.  On room air.  Lactic acid 2.6.  MRI with osteomyelitis.  Blood glucose 491.  A1c 11.  She was admitted with uncontrolled diabetes, right foot cellulitis and fifth toe osteomyelitis.  Patient was admitted to the hospital and treated with IV antibiotics and fluid with improvement.  Underwent surgical resection of the fifth toe with closure of wound.  Mobilized.  Surgically stable as per surgery.  They plan to keep dressing intact and follow-up in 1 week.  Negative cultures.  Will treat with 1 more week of ciprofloxacin by mouth.  Patient reportedly had been told she has prediabetes.  She was never on treatment.  A1c was 11.  She is not ready to do insulin.  She wants to try lifestyle modifications and diet and exercise.  She wants to try oral medications.  Will discharge patient on glipizide and Metformin, detailed diabetic education done.  She was given follow-up at transitional care clinic for ongoing follow-up with provider.  Discharge Diagnoses:  Principal Problem:   Osteomyelitis of fifth toe of right foot (HCC) Active Problems:   Diabetes mellitus type II, uncontrolled (HCC)    Discharge  Instructions  Discharge Instructions    Call MD for:  redness, tenderness, or signs of infection (pain, swelling, redness, odor or green/yellow discharge around incision site)   Complete by: As directed    Call MD for:  severe uncontrolled pain   Complete by: As directed    Call MD for:  temperature >100.4   Complete by: As directed    Diet Carb Modified   Complete by: As directed    Discharge instructions   Complete by: As directed    Check your blood sugars and keep a log book of records and bring to doctors office   Increase activity slowly   Complete by: As directed    Leave dressing on - Keep it clean, dry, and intact until clinic visit   Complete by: As directed      Allergies as of 01/27/2020   No Known Allergies     Medication List    TAKE these medications   Blood Glucose Monitoring Suppl Devi Inject 1 each into the skin with breakfast, with lunch, and with evening meal.   ciprofloxacin 500 MG tablet Commonly known as: Cipro Take 1 tablet (500 mg total) by mouth 2 (two) times daily for 7 days.   glipiZIDE 5 MG tablet Commonly known as: GLUCOTROL Take 1 tablet (5 mg total) by mouth 2 (two) times daily.   ibuprofen 200 MG tablet Commonly known as: ADVIL Take 400 mg by mouth every 6 (six) hours as needed for fever, headache or mild pain.   metFORMIN 500 MG tablet Commonly known as: Glucophage Take 1 tablet (500 mg total) by mouth 2 (two) times daily with  a meal.   vitamin B-12 500 MCG tablet Commonly known as: CYANOCOBALAMIN Take 500 mcg by mouth daily.            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   Specialty: Orthopedic Surgery Contact information: [address] [place] [address] [phone]        [facility]. Go on 02/10/2020.   Why: You are scheduled for a primary care physician appointment at the clinic for February 10, 2020 at 9:30 am. Contact information: [address] [place] [address] [phone]             No Known Allergies  Consultations:  Orthopedics   Procedures/Studies: MRI Right foot with and without contrast  Result Date: 01/25/2020 CLINICAL DATA:  Diabetic foot wound to the right fifth toe EXAM: MRI OF THE RIGHT FOREFOOT WITHOUT AND WITH CONTRAST TECHNIQUE: Multiplanar, multisequence MR imaging of the right forefoot was performed before and after the administration of intravenous contrast. CONTRAST:  8mL GADAVIST GADOBUTROL 1 MMOL/ML IV SOLN COMPARISON:  X-ray 01/24/2020 FINDINGS: Bones/Joint/Cartilage Bone marrow edema throughout the right fifth toe distal phalanx. Subtle low T1 signal at the level of the distal tuft along the dorsal margin (series 4, image 12) suspicious for a site of osteomyelitis. Preservation of the bone marrow signal within the proximal phalanx of the fifth toe. The remaining included osseous structures of the forefoot are intact with normal signal. No acute fracture. No dislocation. Minimal degenerative changes of the first MTP joint. No joint effusion. Ligaments Intact Lisfranc ligament. Collateral ligaments of forefoot appear intact. Intact plantar plates. Muscles and Tendons Muscle bulk and signal intensity is within normal limits. Intact flexor and extensor tendons. No appreciable tenosynovial fluid collection. Soft tissues Soft tissue edema and  enhancement of the fifth toe with superficial skin ulceration along the dorsal surface. Subcutaneous edema overlies the dorsum of the forefoot. No organized soft tissue fluid collection. IMPRESSION: Superficial skin ulceration of the right fifth toe with surrounding cellulitis. Bone marrow edema within the distal phalanx of the right fifth toe with subtle T1 signal changes of the distal tuft suspicious for a site of osteomyelitis. These results will be called to the ordering clinician or representative by the Radiologist Assistant, and communication documented in the PACS or [employer]. Electronically Signed   By: Duanne Guess D.O.   On: 01/25/2020 08:14   DG Foot Complete Right  Result Date: 01/24/2020 CLINICAL DATA:  Right foot swelling.  Wound near the fifth toe EXAM: RIGHT FOOT COMPLETE - 3+ VIEW COMPARISON:  None. FINDINGS: There is no evidence of  fracture or dislocation. Small nonspecific mineralized density along the lateral aspect of the fifth toe IP joint, possibly a site of calcific periarthritis. No bony erosion or periostitis. Small plantar calcaneal spur. There is no evidence of arthropathy or other focal bone abnormality. No focal soft tissue swelling, obvious ulceration, or soft tissue gas. IMPRESSION: 1. No acute osseous abnormality. No radiographic evidence of osteomyelitis. 2. Small nonspecific mineralized density along the lateral aspect of the fifth toe IP joint, possibly a site of calcific periarthritis. Electronically Signed   By: Duanne Guess D.O.   On: 01/24/2020 13:14   VAS [place] ABI WITH/WO TBI  Result Date: 01/25/2020 LOWER EXTREMITY DOPPLER STUDY Indications: Ulceration, peripheral artery disease, and Ulceration Right 5th              toe. High Risk Factors: Diabetes.  Performing Technologist: Jannet Askew RCT RDMS  Examination Guidelines: A complete evaluation includes at minimum, Doppler waveform signals and systolic blood pressure reading at the level of  bilateral brachial, anterior tibial, and posterior tibial arteries, when vessel segments are accessible. Bilateral testing is considered an integral part of a complete examination. Photoelectric Plethysmograph (PPG) waveforms and toe systolic pressure readings are included as required and additional duplex testing as needed. Limited examinations for reoccurring indications may be performed as noted.  ABI Findings: +---------+------------------+-----+---------+--------+ Right    Rt Pressure (mmHg)IndexWaveform Comment  +---------+------------------+-----+---------+--------+ Brachial 140                    triphasic         +---------+------------------+-----+---------+--------+ PTA      124               0.89 triphasic         +---------+------------------+-----+---------+--------+ DP       123               0.88 triphasic         +---------+------------------+-----+---------+--------+ Great Toe125               0.89 Normal            +---------+------------------+-----+---------+--------+ +---------+------------------+-----+---------+-------+ Left     Lt Pressure (mmHg)IndexWaveform Comment +---------+------------------+-----+---------+-------+ Brachial 128                    triphasic        +---------+------------------+-----+---------+-------+ PTA      145               1.04 triphasic        +---------+------------------+-----+---------+-------+ DP       141               1.01 triphasic        +---------+------------------+-----+---------+-------+ Great Toe119               0.85 Normal           +---------+------------------+-----+---------+-------+ TOES Findings: +----------+---------------+--------+-------+ Right ToesPressure (mmHg)WaveformComment +----------+---------------+--------+-------+ 1st Digit 125            Normal          +----------+---------------+--------+-------+  +---------+---------------+--------+-------+ Left ToesPressure  (mmHg)WaveformComment +---------+---------------+--------+-------+ 1st [id number]            Normal          +---------+---------------+--------+-------+    Summary: Right: Resting right ankle-brachial index indicates mild right lower extremity arterial disease. Left: Resting left ankle-brachial index is within normal range. No evidence of significant left lower extremity arterial disease.  *See table(s) above for measurements  and observations.  Electronically signed by Gretta Began MD on 01/25/2020 at 4:20:46 PM.    Final    (Echo, Carotid, EGD, Colonoscopy, ERCP)    Subjective: Patient seen and examined.  No overnight events.  Has some pain when she bears weight on the right foot. Translator was used and detailed discussion was done about diabetic management, medications, hypoglycemic symptoms and how to prevent it.  Also discussed about wound care and follow-ups.  Patient understands the instructions.   Discharge Exam: Vitals:   01/27/20 0300 01/27/20 0826  BP: 128/71 101/73  Pulse: 70 91  Resp: 19 18  Temp: 98.5 F (36.9 C) 99.2 F (37.3 C)  SpO2: 98% 95%   Vitals:   01/26/20 1641 01/26/20 1945 01/27/20 0300 01/27/20 0826  BP: 119/81 130/81 128/71 101/73  Pulse: 78 73 70 91  Resp: 16 20 19 18   Temp: 98.3 F (36.8 C) 98.7 F (37.1 C) 98.5 F (36.9 C) 99.2 F (37.3 C)  TempSrc: Oral Oral Oral Oral  SpO2: 97% 97% 98% 95%  Weight:      Height:        General: Pt is alert, awake, not in acute distress Cardiovascular: RRR, S1/S2 +, no rubs, no gallops Respiratory: CTA bilaterally, no wheezing, no rhonchi Abdominal: Soft, NT, ND, bowel sounds + Extremities: Right foot on immediate postop dressing.  Not removed by me.    The results of significant diagnostics from this hospitalization (including imaging, microbiology, ancillary and laboratory) are listed below for reference.     Microbiology: Recent Results (from the past 240 hour(s))  Blood culture (routine x 2)      Status: None (Preliminary result)   Collection Time: 01/24/20 12:48 PM   Specimen: BLOOD  Result Value Ref Range Status   Specimen Description BLOOD RIGHT ANTECUBITAL  Final   Special Requests   Final    BOTTLES DRAWN AEROBIC AND ANAEROBIC Blood Culture adequate volume   Culture   Final    NO GROWTH 2 DAYS Performed at [facility], [address] [place], [place]    Report Status PENDING  Incomplete  Blood culture (routine x 2)     Status: None (Preliminary result)   Collection Time: 01/25/20  3:00 AM   Specimen: BLOOD  Result Value Ref Range Status   Specimen Description BLOOD LEFT ANTECUBITAL  Final   Special Requests   Final    BOTTLES DRAWN AEROBIC AND ANAEROBIC Blood Culture adequate volume   Culture   Final    NO GROWTH 1 DAY Performed at [facility], [address], [place], [place]    Report Status PENDING  Incomplete  Respiratory Panel by RT PCR (Flu A&B, Covid) - Nasopharyngeal Swab     Status: None   Collection Time: 01/25/20  3:00 AM   Specimen: Nasopharyngeal Swab  Result Value Ref Range Status   SARS Coronavirus 2 by RT PCR NEGATIVE NEGATIVE Final    Comment: (NOTE) SARS-CoV-2 target nucleic acids are NOT DETECTED.  The SARS-CoV-2 RNA is generally detectable in upper respiratoy specimens during the acute phase of infection. The lowest concentration of SARS-CoV-2 viral copies this assay can detect is 131 copies/mL. A negative result does not preclude SARS-Cov-2 infection and should not be used as the sole basis for treatment or other patient management decisions. A negative result may occur with  improper specimen collection/handling, submission of specimen other than nasopharyngeal swab, presence of viral mutation(s) within the areas targeted  by this assay, and inadequate number of viral copies (<131 copies/mL). A negative result must be combined with clinical observations, patient history, and epidemiological information.  The expected result is Negative.  Fact Sheet for Patients:  https://www.moore.com/https://www.fda.gov/media/142436/download  Fact Sheet for Healthcare Providers:  https://www.young.biz/https://www.fda.gov/media/142435/download  This test is no t yet approved or cleared by the [place] FDA and  has been authorized for detection and/or diagnosis of SARS-CoV-2 by FDA under an Emergency Use Authorization (EUA). This EUA will remain  in effect (meaning this test can be used) for the duration of the COVID-19 declaration under Section 564(b)(1) of the Act, 21 U.S.C. section 360bbb-3(b)(1), unless the authorization is terminated or revoked sooner.     Influenza A by PCR NEGATIVE NEGATIVE Final   Influenza B by PCR NEGATIVE NEGATIVE Final    Comment: (NOTE) The Xpert Xpress SARS-CoV-2/FLU/RSV assay is intended as an aid in  the diagnosis of influenza from Nasopharyngeal swab specimens and  should not be used as a sole basis for treatment. Nasal washings and  aspirates are unacceptable for Xpert Xpress SARS-CoV-2/FLU/RSV  testing.  Fact Sheet for Patients: https://www.moore.com/https://www.fda.gov/media/142436/download  Fact Sheet for Healthcare Providers: https://www.young.biz/https://www.fda.gov/media/142435/download  This test is not yet approved or cleared by the [place] FDA and  has been authorized for detection and/or diagnosis of SARS-CoV-2 by  FDA under an Emergency Use Authorization (EUA). This EUA will remain  in effect (meaning this test can be used) for the duration of the  Covid-19 declaration under Section 564(b)(1) of the Act, 21  U.S.C. section 360bbb-3(b)(1), unless the authorization is  terminated or revoked. Performed at [facility], [address] [place], [place] [address]   Surgical PCR screen     Status: None   Collection Time: 01/26/20  9:36 AM   Specimen: Nasal Mucosa; Nasal Swab  Result Value Ref Range Status   MRSA, PCR NEGATIVE NEGATIVE Final   Staphylococcus aureus NEGATIVE NEGATIVE Final    Comment: (NOTE) The  Xpert SA Assay (FDA approved for NASAL specimens in patients 43 years of age and older), is one component of a comprehensive surveillance program. It is not intended to diagnose infection nor to guide or monitor treatment. Performed at [facility], [address] [place], [place] [address]      Labs: BNP (last 3 results) No results for input(s): BNP in the last 8760 hours. Basic Metabolic Panel: Recent Labs  Lab 01/24/20 1238 01/25/20 0530 01/26/20 0200 01/27/20 0428  NA 132* 135 135 133*  K 4.1 3.8 3.7 3.8  CL 97* 100 100 99  CO2 23 24 26 25   GLUCOSE 491* 315* 245* 276*  BUN 9 13 10 8   CREATININE 0.86 0.64 0.56 0.57  CALCIUM 9.1 8.5* 9.1 8.4*   Liver Function Tests: Recent Labs  Lab 01/24/20 1238  AST 18  ALT 22  ALKPHOS 145*  BILITOT 0.5  PROT 7.4  ALBUMIN 3.3*   No results for input(s): LIPASE, AMYLASE in the last 168 hours. No results for input(s): AMMONIA in the last 168 hours. CBC: Recent Labs  Lab 01/24/20 1238 01/25/20 0530 01/26/20 0200 01/27/20 0428  WBC 10.4 9.3 9.6 8.7  NEUTROABS  --  6.1 5.7 6.1  HGB 14.1 12.2 12.4 11.9*  HCT 41.0 36.3 36.4 34.7*  MCV 91.5 91.4 89.7 91.3  PLT 272 252 271 285   Cardiac Enzymes: No results for input(s): CKTOTAL, CKMB, CKMBINDEX, TROPONINI in the last 168 hours. BNP: Invalid input(s): POCBNP CBG: Recent  Labs  Lab 01/26/20 1421 01/26/20 1606 01/26/20 2121 01/27/20 0635 01/27/20 1157  GLUCAP 194* 222* 330* 268* 291*   D-Dimer No results for input(s): DDIMER in the last 72 hours. Hgb A1c Recent Labs    01/25/20 0530  HGBA1C 11.0*   Lipid Profile No results for input(s): CHOL, HDL, LDLCALC, TRIG, CHOLHDL, LDLDIRECT in the last 72 hours. Thyroid function studies No results for input(s): TSH, T4TOTAL, T3FREE, THYROIDAB in the last 72 hours.  Invalid input(s): FREET3 Anemia work up No results for input(s): VITAMINB12, FOLATE, FERRITIN, TIBC, IRON, RETICCTPCT in the last 72  hours. Urinalysis No results found for: COLORURINE, APPEARANCEUR, LABSPEC, PHURINE, GLUCOSEU, HGBUR, BILIRUBINUR, KETONESUR, PROTEINUR, UROBILINOGEN, NITRITE, LEUKOCYTESUR Sepsis Labs Invalid input(s): PROCALCITONIN,  WBC,  LACTICIDVEN Microbiology Recent Results (from the past 240 hour(s))  Blood culture (routine x 2)     Status: None (Preliminary result)   Collection Time: 01/24/20 12:48 PM   Specimen: BLOOD  Result Value Ref Range Status   Specimen Description BLOOD RIGHT ANTECUBITAL  Final   Special Requests   Final    BOTTLES DRAWN AEROBIC AND ANAEROBIC Blood Culture adequate volume   Culture   Final    NO GROWTH 2 DAYS Performed at [facility], [address], [place], [place] [address]    Report Status PENDING  Incomplete  Blood culture (routine x 2)     Status: None (Preliminary result)   Collection Time: 01/25/20  3:00 AM   Specimen: BLOOD  Result Value Ref Range Status   Specimen Description BLOOD LEFT ANTECUBITAL  Final   Special Requests   Final    BOTTLES DRAWN AEROBIC AND ANAEROBIC Blood Culture adequate volume   Culture   Final    NO GROWTH 1 DAY Performed at [facility], [address] [place], [place] [address]    Report Status PENDING  Incomplete  Respiratory Panel by RT PCR (Flu A&B, Covid) - Nasopharyngeal Swab     Status: None   Collection Time: 01/25/20  3:00 AM   Specimen: Nasopharyngeal Swab  Result Value Ref Range Status   SARS Coronavirus 2 by RT PCR NEGATIVE NEGATIVE Final    Comment: (NOTE) SARS-CoV-2 target nucleic acids are NOT DETECTED.  The SARS-CoV-2 RNA is generally detectable in upper respiratoy specimens during the acute phase of infection. The lowest concentration of SARS-CoV-2 viral copies this assay can detect is 131 copies/mL. A negative result does not preclude SARS-Cov-2 infection and should not be used as the sole basis for treatment or other patient management decisions. A negative result may occur with  improper  specimen collection/handling, submission of specimen other than nasopharyngeal swab, presence of viral mutation(s) within the areas targeted by this assay, and inadequate number of viral copies (<131 copies/mL). A negative result must be combined with clinical observations, patient history, and epidemiological information. The expected result is Negative.  Fact Sheet for Patients:  https://www.moore.com/  Fact Sheet for Healthcare Providers:  https://www.young.biz/  This test is no t yet approved or cleared by the [place] FDA and  has been authorized for detection and/or diagnosis of SARS-CoV-2 by FDA under an Emergency Use Authorization (EUA). This EUA will remain  in effect (meaning this test can be used) for the duration of the COVID-19 declaration under Section 564(b)(1) of the Act, 21 U.S.C. section 360bbb-3(b)(1), unless the authorization is terminated or revoked sooner.     Influenza A by PCR NEGATIVE NEGATIVE Final   Influenza B by PCR NEGATIVE  NEGATIVE Final    Comment: (NOTE) The Xpert Xpress SARS-CoV-2/FLU/RSV assay is intended as an aid in  the diagnosis of influenza from Nasopharyngeal swab specimens and  should not be used as a sole basis for treatment. Nasal washings and  aspirates are unacceptable for Xpert Xpress SARS-CoV-2/FLU/RSV  testing.  Fact Sheet for Patients: https://www.moore.com/  Fact Sheet for Healthcare Providers: https://www.young.biz/  This test is not yet approved or cleared by the [place] FDA and  has been authorized for detection and/or diagnosis of SARS-CoV-2 by  FDA under an Emergency Use Authorization (EUA). This EUA will remain  in effect (meaning this test can be used) for the duration of the  Covid-19 declaration under Section 564(b)(1) of the Act, 21  U.S.C. section 360bbb-3(b)(1), unless the authorization is  terminated or revoked. Performed at  [facility], [address], [place], [place] [address]   Surgical PCR screen     Status: None   Collection Time: 01/26/20  9:36 AM   Specimen: Nasal Mucosa; Nasal Swab  Result Value Ref Range Status   MRSA, PCR NEGATIVE NEGATIVE Final   Staphylococcus aureus NEGATIVE NEGATIVE Final    Comment: (NOTE) The Xpert SA Assay (FDA approved for NASAL specimens in patients 79 years of age and older), is one component of a comprehensive surveillance program. It is not intended to diagnose infection nor to guide or monitor treatment. Performed at [facility], [address], [place], [place] [address]      Time coordinating discharge: 35 minutes  SIGNED:   Dorcas Carrow, MD  [facility] 01/27/2020, 1:34 PM

## 2020-01-27 NOTE — Progress Notes (Signed)
Spoke with patient with [employer] interpreter about patient's diabetes. States that she had been diagnosed with prediabetes last year and was given medication, but stopped taking it over the year. States that she  will see the physician at her work at a [employer] for followup. Was instructed on using a [employer] meter that was given to her with supplies. States that her son can help her at home with it, as well. Given information on diabetes in Spanish booklet and given plate method for eating in Spanish. Instructed to check blood sugars twice a day and record. Given information on the [employer] meter, strips, and lancets so that she would know what she will need once her supplies are used.  Smith Mince RN BSN CDE Diabetes Coordinator Pager: [phone]  8am-5pm

## 2020-01-27 NOTE — TOC Initial Note (Addendum)
Transition of Care [facility]) - Initial/Assessment Note    Patient Details  Name: Alison Gates MRN: [id number] Date of Birth: 1975/09/19  Transition of Care [facility]) CM/SW Contact:    Curlene Labrum, RN Phone Number: 01/27/2020, 11:39 AM  Clinical Narrative:                 Case management met with the patient regarding transitions of care to home today with family.  Assessment and interview was conducted with spanish-interpretor I pad in the room.  The patient states that she lives with her children at home and has transportation provided from friends and family members.  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  She states that she stopped taking her diabetes medications more than a year ago and does not have a current PCP - patient was set up with [facility] appointment on Nov. 4 - see discharge summary.  The patient has available cash - limited to pay for her medications today - medications were provided through the [facility] program and [place] today including glucometer machine.  The patient was provided with diabetes education information in the discharge instructions.  TOC case management is waiting on PT evaluation to determine ambulation assistive devices needed for home and she will be given dme from the [facility] [employer] supply closet.  Will continue to follow for discharge needs.  Case management spoke with Tu, RN at bedside and she will be providing the patient with a 3:1 before she is discharged home today from the [facility] supply closet.  Expected Discharge Plan: Home/Self Care Barriers to Discharge: Inadequate or no insurance (Waiting on PT evaluation for equipment for discharge.)   Patient Goals and CMS Choice Patient states their goals for this hospitalization and ongoing recovery are:: Patient plans to discharge home with family. CMS Medicare.gov Compare Post Acute Care list provided to:: Patient Choice offered to / list presented to :  Patient  Expected Discharge Plan and Services Expected Discharge Plan: Home/Self Care In-house Referral: PCP / [facility] Discharge Planning Services: CM Consult, [facility], [facility], Follow-up appt scheduled Post Acute Care Choice: Durable Medical Equipment Living arrangements for the past 2 months: Apartment Expected Discharge Date: 01/27/20                                    Prior Living Arrangements/Services Living arrangements for the past 2 months: Apartment Lives with:: Relatives (Lives with her children and sister/ family plan to transport her home) Patient language and need for interpreter reviewed:: Yes Do you feel safe going back to the place where you live?: Yes      Need for Family Participation in Patient Care: Yes (Comment) Care giver support system in place?: Yes (comment)   Criminal Activity/Legal Involvement Pertinent to Current Situation/Hospitalization: No - Comment as needed  Activities of Daily Living      Permission Sought/Granted Permission sought to share information with : Case Manager Permission granted to share information with : Yes, Verbal Permission Granted        Permission granted to share info w Relationship: sister, Vallerie - [phone]     Emotional Assessment Appearance:: Appears stated age Attitude/Demeanor/Rapport: Gracious Affect (typically observed): Accepting Orientation: : Oriented to Self, Oriented to Place, Oriented to  Time, Oriented to Situation Alcohol / Substance Use: Not Applicable Psych Involvement: No (comment)  Admission diagnosis:  Cellulitis of  right foot [P69.249] Right foot infection [L08.9] Patient Active Problem List   Diagnosis Date Noted  . Osteomyelitis of fifth toe of right foot ([place]) 01/25/2020  . Diabetes mellitus type II, uncontrolled ([place]) 01/25/2020   PCP:  Patient, No Pcp Per Pharmacy:   [facility] [place], [place] - [place] AT [place]  [place] [address] Phone: [phone] Fax: [place], [place] [address] [place] [address] Phone: [phone] Fax: [phone]     Social Determinants of Health (SDOH) Interventions    Readmission Risk Interventions Readmission Risk Prevention Plan 01/27/2020  Post Dischage Appt Complete  Medication Screening Complete  Transportation Screening Complete

## 2020-01-27 NOTE — Progress Notes (Signed)
Discharge package printed. Instructions given to patient. Patient demonstrated blood sugar check. Verbalizes understanding.

## 2020-01-27 NOTE — Evaluation (Signed)
Physical Therapy Evaluation & Discharge Patient Details Name: Alison Gates MRN: [id number] DOB: [birth year] Today's Date: 01/27/2020   History of Present Illness  Pt is a 44 y.o. Spanish-speaking female admitted 01/24/20 with R 5th toe osteomyelitis. S/p R foot 5th ray amputation 10/20. PMH includes diabetes.    Clinical Impression  Patient evaluated by Physical Therapy with no further acute PT needs identified. PTA, pt independent, lives with children and works at [employer]. Today, pt mobilizing well mod indep with RW; good ability to maintain RLE NWB precautions. Educ re: precautions, positioning, edema control, DVT prevention, therex, and importance of mobility. All education has been completed and the patient has no further questions. Acute PT is signing off. Thank you for this referral.    Follow Up Recommendations No PT follow up    Equipment Recommendations  Rolling walker with 5" wheels    Recommendations for Other Services       Precautions / Restrictions Precautions Precautions: Fall Restrictions Weight Bearing Restrictions: Yes RLE Weight Bearing: Non weight bearing Other Position/Activity Restrictions: "Ideally NWB, but TDWB acceptable" per Dr. Audrie Lia note      Mobility  Bed Mobility               General bed mobility comments: Received sitting in recliner    Transfers Overall transfer level: Modified independent Equipment used: None;Rolling walker (2 wheeled)             General transfer comment: Can stand with and without RW, good ability to maintain RLE NWB precautions  Ambulation/Gait Ambulation/Gait assistance: Modified independent (Device/Increase time) Gait Distance (Feet): 44 Feet Assistive device: Rolling walker (2 wheeled)   Gait velocity: Decreased   General Gait Details: Slow, controlled gait, utilizing hop-to gait pattern on LLE in order to maintain RLE NWB precautions; mod indep with RW  Stairs            Wheelchair  Mobility    Modified Rankin (Stroke Patients Only)       Balance Overall balance assessment: Modified Independent;No apparent balance deficits (not formally assessed)                                           Pertinent Vitals/Pain Pain Assessment: Faces Faces Pain Scale: Hurts a little bit Pain Location: R foot Pain Descriptors / Indicators: Discomfort;Operative site guarding Pain Intervention(s): Monitored during session;Limited activity within patient's tolerance    Home Living Family/patient expects to be discharged to:: Private residence Living Arrangements: Children Available Help at Discharge: Family;Available PRN/intermittently Type of Home: House Home Access: Level entry     Home Layout: Two level;Able to live on main level with bedroom/bathroom Home Equipment: None      Prior Function Level of Independence: Independent         Comments: Works in Acupuncturist, on Health visitor all day     Higher education careers adviser        Extremity/Trunk Assessment   Upper Extremity Assessment Upper Extremity Assessment: Overall WFL for tasks assessed    Lower Extremity Assessment Lower Extremity Assessment: RLE deficits/detail RLE Deficits / Details: s/p R 5th ray amputation; hip and knee >3/5 throughout    Cervical / Trunk Assessment Cervical / Trunk Assessment: Normal  Communication   Communication: Prefers language other than English;Interpreter utilized  Cognition Arousal/Alertness: Awake/alert Behavior During Therapy: WFL for tasks assessed/performed Overall Cognitive Status: Within Functional Limits for tasks assessed  General Comments General comments (skin integrity, edema, etc.): Educ on edema control, DVT prevention (including frequent hip/knee ROM), fall risk reduction, safety recommendations. Ipad stratus interpreter Radiation protection practitioner) utilized    Exercises Other Exercises Other Exercises: Educ on  HEP - LAQ, SLR, hip abd/add   Assessment/Plan    PT Assessment Patent does not need any further PT services  PT Problem List         PT Treatment Interventions      PT Goals (Current goals can be found in the Care Plan section)  Acute Rehab PT Goals PT Goal Formulation: All assessment and education complete, DC therapy    Frequency     Barriers to discharge        Co-evaluation               AM-PAC PT "6 Clicks" Mobility  Outcome Measure Help needed turning from your back to your side while in a flat bed without using bedrails?: None Help needed moving from lying on your back to sitting on the side of a flat bed without using bedrails?: None Help needed moving to and from a bed to a chair (including a wheelchair)?: None Help needed standing up from a chair using your arms (e.g., wheelchair or bedside chair)?: None Help needed to walk in hospital room?: None Help needed climbing 3-5 steps with a railing? : A Little 6 Click Score: 23    End of Session   Activity Tolerance: Patient tolerated treatment well Patient left: in chair;with call bell/phone within reach Nurse Communication: Mobility status PT Visit Diagnosis: Other abnormalities of gait and mobility (R26.89);Pain Pain - Right/Left: Right Pain - part of body: Ankle and joints of foot    Time: 4235-3614 PT Time Calculation (min) (ACUTE ONLY): 18 min   Charges:   PT Evaluation $PT Eval Low Complexity: 1 Low     Ina Homes, PT, DPT Acute Rehabilitation Services  Pager [phone] Office [phone]  Malachy Chamber 01/27/2020, 12:04 PM

## 2020-01-27 NOTE — Evaluation (Signed)
Occupational Therapy Evaluation Patient Details Name: Alison Gates MRN: [id number] DOB: April 12, 1975 Today's Date: 01/27/2020    History of Present Illness Pt is a 44 y.o. Spanish-speaking female admitted 01/24/20 with R 5th toe osteomyelitis. S/p R foot 5th ray amputation 10/20. PMH includes diabetes.   Clinical Impression   PTA, pt Independent in all daily tasks and works full time at a factory. Pt presents now s/p surgery with good maintenance of WB precautions. Pt able to navigate mobility to/from bathroom with RW, complete toileting task and ADLs standing at sink with Modified Independence. Educated on safety with tub transfers at home with pt verbalizing understanding. No further skilled OT services needed at acute level or on discharge. OT to sign off. Please reconsult if needed.    Follow Up Recommendations  No OT follow up    Equipment Recommendations  3 in 1 bedside commode;Other (comment) (RW)    Recommendations for Other Services       Precautions / Restrictions Precautions Precautions: Fall Restrictions Weight Bearing Restrictions: Yes RLE Weight Bearing: Non weight bearing Other Position/Activity Restrictions: "Ideally NWB, but TDWB acceptable" per Dr. Audrie Lia note      Mobility Bed Mobility               General bed mobility comments: Received sitting in recliner    Transfers Overall transfer level: Modified independent Equipment used: Rolling walker (2 wheeled)             General transfer comment: No assist needed for transfers    Balance Overall balance assessment: Modified Independent;No apparent balance deficits (not formally assessed)                                         ADL either performed or assessed with clinical judgement   ADL Overall ADL's : Needs assistance/impaired Eating/Feeding: Independent;Sitting   Grooming: Standing;Wash/dry hands;Wash/dry face;Oral care;Modified independent Grooming Details  (indicate cue type and reason): No assist needed, use of RW or UE support on sink for task, no LOB Upper Body Bathing: Independent;Sitting   Lower Body Bathing: Sit to/from stand;Modified independent   Upper Body Dressing : Independent;Sitting   Lower Body Dressing: Sit to/from stand;Modified independent Lower Body Dressing Details (indicate cue type and reason): Able to don post op shoe Toilet Transfer: Ambulation;Regular Toilet;RW;Modified Community education officer Details (indicate cue type and reason): mobility to/from bathroom with RW, no LOB and good maintenance of WB precautions Toileting- Clothing Manipulation and Hygiene: Sit to/from stand;Modified independent Toileting - Clothing Manipulation Details (indicate cue type and reason): No assist needed for hygiene, able to turn and flush toilet without LOB     Functional mobility during ADLs: Rolling walker;Modified independent General ADL Comments: Good maintenance of WB precautions, no major safety concerns     Vision Baseline Vision/History: No visual deficits Patient Visual Report: No change from baseline Vision Assessment?: No apparent visual deficits     Perception     Praxis      Pertinent Vitals/Pain Pain Assessment: Faces Faces Pain Scale: No hurt Pain Location: R foot Pain Descriptors / Indicators: Discomfort;Operative site guarding Pain Intervention(s): Monitored during session;Limited activity within patient's tolerance     Hand Dominance Right   Extremity/Trunk Assessment Upper Extremity Assessment Upper Extremity Assessment: Overall WFL for tasks assessed   Lower Extremity Assessment Lower Extremity Assessment: Defer to PT evaluation RLE Deficits / Details: s/p R 5th ray amputation;  hip and knee >3/5 throughout   Cervical / Trunk Assessment Cervical / Trunk Assessment: Normal   Communication Communication Communication: Prefers language other than English;Interpreter utilized   Cognition  Arousal/Alertness: Awake/alert Behavior During Therapy: WFL for tasks assessed/performed Overall Cognitive Status: Within Functional Limits for tasks assessed                                     General Comments  Educated on safety in tub transfers, recommendation for shower seat (can use BSC in shower)    Exercises Other Exercises Other Exercises: Educ on HEP - LAQ, SLR, hip abd/add   Shoulder Instructions      Home Living Family/patient expects to be discharged to:: Private residence Living Arrangements: Children Available Help at Discharge: Family;Available PRN/intermittently Type of Home: House Home Access: Level entry     Home Layout: Two level;Able to live on main level with bedroom/bathroom     Bathroom Shower/Tub: Chief Strategy Officer: Standard     Home Equipment: None          Prior Functioning/Environment Level of Independence: Independent        Comments: Works in Acupuncturist, on Health visitor all day        OT Problem List:        OT Treatment/Interventions:      OT Goals(Current goals can be found in the care plan section) Acute Rehab OT Goals Patient Stated Goal: be able to go home   OT Frequency:     Barriers to D/C:            Co-evaluation              AM-PAC OT "6 Clicks" Daily Activity     Outcome Measure Help from another person eating meals?: None Help from another person taking care of personal grooming?: None Help from another person toileting, which includes using toliet, bedpan, or urinal?: None Help from another person bathing (including washing, rinsing, drying)?: None Help from another person to put on and taking off regular upper body clothing?: None Help from another person to put on and taking off regular lower body clothing?: None 6 Click Score: 24   End of Session Equipment Utilized During Treatment: Rolling walker Nurse Communication: Mobility status  Activity Tolerance: Patient  tolerated treatment well Patient left: in chair;with call bell/phone within reach;Other (comment) (with NT present)                   Time: 1139-1202 OT Time Calculation (min): 23 min Charges:  OT General Charges $OT Visit: 1 Visit OT Evaluation $OT Eval Low Complexity: 1 Low  Lorre Munroe, OTR/L  Lorre Munroe 01/27/2020, 1:14 PM

## 2020-01-27 NOTE — Discharge Instructions (Signed)
Glucose Products:  ReliOnT glucose products raise low blood sugar fast. Tablets are free of fat, caffeine, sodium and gluten. They are portable and easy to carry, making it easier for people with diabetes to BE PREPARED for lows.  Blood Glucose Monitors ReliOnT offers a full range of blood glucose testing options to provide an accurate, affordable system that meets each person's unique needs and preferences. Prime Meter....................................... $9.00 Prime Test Strips . 25 test strips.................................... $5.00 . 50 test strips.................................... $9.00 . 100 test strips.................................$17.88 Premier Test Strips . 50 test strips.................................... $9.00 . 100 test strips.................................$17.88

## 2020-01-27 NOTE — Progress Notes (Signed)
Patient is postop day 1 status post fifth toe amputation.  She is lying in bed is comfortable has no complaints  Vital signs stable dressing clean dry and intact   Plan can discharge from a orthopedic standpoint with the 1 week follow-up in our office

## 2020-01-29 LAB — CULTURE, BLOOD (ROUTINE X 2)
Culture: NO GROWTH
Special Requests: ADEQUATE

## 2020-01-30 LAB — CULTURE, BLOOD (ROUTINE X 2)
Culture: NO GROWTH
Special Requests: ADEQUATE

## 2020-02-01 NOTE — Anesthesia Postprocedure Evaluation (Signed)
Anesthesia Post Note  Patient: Alison Gates  Procedure(s) Performed: RIGHT FOOT LITTLE TOE AMPUTATION (Right )     Patient location during evaluation: PACU Anesthesia Type: MAC Level of consciousness: awake and alert Pain management: pain level controlled Vital Signs Assessment: post-procedure vital signs reviewed and stable Respiratory status: spontaneous breathing, nonlabored ventilation, respiratory function stable and patient connected to nasal cannula oxygen Cardiovascular status: stable and blood pressure returned to baseline Postop Assessment: no apparent nausea or vomiting Anesthetic complications: no   No complications documented.  Last Vitals:  Vitals:   01/27/20 0300 01/27/20 0826  BP: 128/71 101/73  Pulse: 70 91  Resp: 19 18  Temp: 36.9 C 37.3 C  SpO2: 98% 95%    Last Pain:  Vitals:   01/27/20 0826  TempSrc: Oral  PainSc:                  [name]

## 2020-02-02 ENCOUNTER — Other Ambulatory Visit: Payer: Self-pay

## 2020-02-02 ENCOUNTER — Encounter: Payer: Self-pay | Admitting: Family

## 2020-02-02 ENCOUNTER — Ambulatory Visit (INDEPENDENT_AMBULATORY_CARE_PROVIDER_SITE_OTHER): Payer: Self-pay | Admitting: Family

## 2020-02-02 DIAGNOSIS — Z89421 Acquired absence of other right toe(s): Secondary | ICD-10-CM

## 2020-02-02 NOTE — Progress Notes (Signed)
   Post-Op Visit Note   Patient: Alison Gates           Date of Birth: 11-Jan-1976           MRN: [id number] Visit Date: 02/02/2020 PCP: Patient, No Pcp Per  Chief Complaint: No chief complaint on file.   HPI:  HPI The patient is a 44 year old woman seen 1 week status post fifth ray amputation.  Ortho Exam Incision is well approximated with sutures there is no gaping no drainage no surrounding erythema no odor no sign of infection  Visit Diagnoses: No diagnosis found.  Plan: Begin daily Dial soap cleansing.  Dry dressing changes.  Minimize weightbearing may transfer through the heel.  She did bring in some FMLA paperwork which we filled out and faxed today to her employer.  Follow-Up Instructions: No follow-ups on file.   Imaging: No results found.  Orders:  No orders of the defined types were placed in this encounter.  No orders of the defined types were placed in this encounter.    PMFS History: Patient Active Problem List   Diagnosis Date Noted  . Osteomyelitis of fifth toe of right foot ([facility]) 01/25/2020  . Diabetes mellitus type II, uncontrolled ([facility]) 01/25/2020   Past Medical History:  Diagnosis Date  . Diabetes ([facility]) 10/2018    History reviewed. No pertinent family history.  Past Surgical History:  Procedure Laterality Date  . AMPUTATION Right 01/26/2020   Procedure: RIGHT FOOT LITTLE TOE AMPUTATION;  Surgeon: [name], MD;  Location: [facility] OR;  Service: Orthopedics;  Laterality: Right;   Social History   Occupational History  . Not on file  Tobacco Use  . Smoking status: Never Smoker  . Smokeless tobacco: Never Used  Substance and Sexual Activity  . Alcohol use: Not Currently  . Drug use: Not Currently  . Sexual activity: Not Currently

## 2020-02-03 ENCOUNTER — Telehealth: Payer: Self-pay | Admitting: Family

## 2020-02-03 NOTE — Telephone Encounter (Signed)
Failed attempts at faxing FMLA forms as they are only receiving partial pages. I emailed forms to [employer] ,benefits supervisor [email or web address]

## 2020-02-09 NOTE — Progress Notes (Signed)
Patient ID: Alison Gates, female   DOB: March 29, 1976, 44 y.o.   MRN: [id number]   Virtual Visit via Telephone Note  I connected with Alison Gates on 02/10/20 at  9:30 AM EDT by telephone and verified that I am speaking with the correct person using two identifiers.  Location: Patient: Alison Gates Provider: Georgian Co, PA-C   I discussed the limitations, risks, security and privacy concerns of performing an evaluation and management service by telephone and the availability of in person appointments. I also discussed with the patient that there may be a patient responsible charge related to this service. The patient expressed understanding and agreed to proceed.  PATIENT visit by telephone virtually in the context of Covid-19 pandemic. Patient location:  home My Location:  [facility] office Persons on the call: me, the patient, and interpreter;  Roomed by [employer] interpreters used and additional time performing visit was required.   History of Present Illness: After hospitalization 10/18-10/21/2021 with osteomyelitis R 5th toe and uncontrolled diabetes.  I had requested to see the patient in person, but she was unable to come in person today.  Her A1C=11.0 on 01/24/2200.  Blood sugars running 130-150.  Her f/up appt with orthopedist is tomorrow to have her stitches removed.  No fever.  Pain is improving.  She has RF on her meds  From discharge summary: Discharge summary: 44 year old Spanish-speaking female with poorly controlled type 2 diabetes not on treatment thinking it was prediabetes presented to the ER with right fifth toe wound and progressive surrounding erythema and swelling.  She had ongoing symptoms for 2 weeks.  In the emergency room afebrile.  On room air.  Lactic acid 2.6.  MRI with osteomyelitis.  Blood glucose 491.  A1c 11.  She was admitted with uncontrolled diabetes, right foot cellulitis and fifth toe osteomyelitis.  Patient was admitted to the hospital and  treated with IV antibiotics and fluid with improvement.  Underwent surgical resection of the fifth toe with closure of wound.  Mobilized.  Surgically stable as per surgery.  They plan to keep dressing intact and follow-up in 1 week.  Negative cultures.  Will treat with 1 more week of ciprofloxacin by mouth.  Patient reportedly had been told she has prediabetes.  She was never on treatment.  A1c was 11.  She is not ready to do insulin.  She wants to try lifestyle modifications and diet and exercise.  She wants to try oral medications.  Will discharge patient on glipizide and Metformin, detailed diabetic education done.  She was given follow-up at transitional care clinic for ongoing follow-up with provider.  Discharge Diagnoses:  Principal Problem:   Osteomyelitis of fifth toe of right foot (HCC) Active Problems:   Diabetes mellitus type II, uncontrolled ([facility])   Observations/Objective:  NAD.  A&Ox3   Assessment and Plan: 1. Uncontrolled type 2 diabetes mellitus with hyperglycemia (HCC) Improving control based on home readings.  Continue current regimen and checking blood sugars.  Work on diet.   - Basic metabolic panel; Future - CBC with Differential/Platelet; Future  2. Osteomyelitis of fifth toe of right foot (HCC) Sees ortho tomorrow - CBC with Differential/Platelet; Future  3. Hospital discharge follow-up Doing well  4. Hyponatremia -bmp ordered   Follow Up Instructions: Assign PCP in 6-8 weeks   I discussed the assessment and treatment plan with the patient. The patient was provided an opportunity to ask questions and all were answered. The patient agreed with the plan and demonstrated an  understanding of the instructions.   The patient was advised to call back or seek an in-person evaluation if the symptoms worsen or if the condition fails to improve as anticipated.  I provided 18 minutes of non-face-to-face time during this encounter.   Georgian Co, PA-C

## 2020-02-10 ENCOUNTER — Ambulatory Visit: Payer: Self-pay | Attending: Physician Assistant | Admitting: Physician Assistant

## 2020-02-10 ENCOUNTER — Encounter: Payer: Self-pay | Admitting: Physician Assistant

## 2020-02-10 ENCOUNTER — Other Ambulatory Visit: Payer: Self-pay

## 2020-02-10 DIAGNOSIS — Z09 Encounter for follow-up examination after completed treatment for conditions other than malignant neoplasm: Secondary | ICD-10-CM

## 2020-02-10 DIAGNOSIS — E871 Hypo-osmolality and hyponatremia: Secondary | ICD-10-CM

## 2020-02-10 DIAGNOSIS — E1165 Type 2 diabetes mellitus with hyperglycemia: Secondary | ICD-10-CM

## 2020-02-10 DIAGNOSIS — M869 Osteomyelitis, unspecified: Secondary | ICD-10-CM

## 2020-02-11 ENCOUNTER — Ambulatory Visit (INDEPENDENT_AMBULATORY_CARE_PROVIDER_SITE_OTHER): Payer: Self-pay | Admitting: Family

## 2020-02-11 ENCOUNTER — Encounter: Payer: Self-pay | Admitting: Family

## 2020-02-11 DIAGNOSIS — Z89421 Acquired absence of other right toe(s): Secondary | ICD-10-CM

## 2020-02-11 NOTE — Progress Notes (Signed)
   Post-Op Visit Note   Patient: Alison Gates           Date of Birth: [birth year]           MRN: [id number] Visit Date: 02/11/2020 PCP: Patient, No Pcp Per  Chief Complaint: No chief complaint on file.   HPI:  HPI The patient is a 44 year old woman seen status post fifth ray amputation on 01/26/20.  Ortho Exam Incision is well approximated with sutures. there is no gaping no drainage no surrounding erythema no odor no sign of infection. Not yet fully healed.  Visit Diagnoses:  1. 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  Dry dressing changes.  Minimize weightbearing. may transfer through the heel.  Short term disability ppwk sent.  Follow-Up Instructions: Return in about 2 weeks (around 02/25/2020).   Imaging: No results found.  Orders:  No orders of the defined types were placed in this encounter.  No orders of the defined types were placed in this encounter.    PMFS History: Patient Active Problem List   Diagnosis Date Noted  . Osteomyelitis of fifth toe of right foot (HCC) 01/25/2020  . Diabetes mellitus type II, uncontrolled (HCC) 01/25/2020   Past Medical History:  Diagnosis Date  . Diabetes (HCC) 10/2018    History reviewed. No pertinent family history.  Past Surgical History:  Procedure Laterality Date  . AMPUTATION Right 01/26/2020   Procedure: RIGHT FOOT LITTLE TOE AMPUTATION;  Surgeon: [name], MD;  Location: [facility] OR;  Service: Orthopedics;  Laterality: Right;   Social History   Occupational History  . Not on file  Tobacco Use  . Smoking status: Never Smoker  . Smokeless tobacco: Never Used  Substance and Sexual Activity  . Alcohol use: Not Currently  . Drug use: Not Currently  . Sexual activity: Not Currently

## 2020-02-18 ENCOUNTER — Encounter: Payer: Self-pay | Admitting: Family

## 2020-02-18 ENCOUNTER — Ambulatory Visit (INDEPENDENT_AMBULATORY_CARE_PROVIDER_SITE_OTHER): Payer: Self-pay | Admitting: Family

## 2020-02-18 VITALS — Ht 61.0 in | Wt 180.0 lb

## 2020-02-18 DIAGNOSIS — M869 Osteomyelitis, unspecified: Secondary | ICD-10-CM

## 2020-02-18 NOTE — Progress Notes (Signed)
   Post-Op Visit Note   Patient: Alison Gates           Date of Birth: Apr 05, 1976           MRN: [id number] Visit Date: 02/18/2020 PCP: Patient, No Pcp Per  Chief Complaint:  Chief Complaint  Patient presents with  . Right Foot - Routine Post Op    01/26/20 right foot 5th toe amputation     HPI:  HPI The patient is a 44 year old woman seen 3 weeks status post right fifth ray amputation has been minimizing her weightbearing in a postop shoe doing daily Dial soap cleansing and dry dressings Ortho Exam Sutures harvested today without incident. There is no gaping or drainage of her incision unfortunately she does have some ischemic ulceration just dorsal to the incision this is no wider than a centimeter there is no erythema or drainage or sign of infection  Visit Diagnoses:  1. Osteomyelitis of fifth toe of right foot (HCC)     Plan: She will continue with her daily dressing changes. May use Neosporin or antibacterial ointment may begin advancing her weightbearing as she tolerates we will follow-up in 2 weeks and reevaluate her return to work at that time  Follow-Up Instructions: Return in about 2 weeks (around 03/03/2020).   Imaging: No results found.  Orders:  No orders of the defined types were placed in this encounter.  No orders of the defined types were placed in this encounter.    PMFS History: Patient Active Problem List   Diagnosis Date Noted  . Osteomyelitis of fifth toe of right foot (HCC) 01/25/2020  . Diabetes mellitus type II, uncontrolled (HCC) 01/25/2020   Past Medical History:  Diagnosis Date  . Diabetes (HCC) 10/2018    History reviewed. No pertinent family history.  Past Surgical History:  Procedure Laterality Date  . AMPUTATION Right 01/26/2020   Procedure: RIGHT FOOT LITTLE TOE AMPUTATION;  Surgeon: [name], MD;  Location: [facility] OR;  Service: Orthopedics;  Laterality: Right;   Social History   Occupational History  . Not on file    Tobacco Use  . Smoking status: Never Smoker  . Smokeless tobacco: Never Used  Substance and Sexual Activity  . Alcohol use: Not Currently  . Drug use: Not Currently  . Sexual activity: Not Currently

## 2020-03-07 ENCOUNTER — Encounter: Payer: Self-pay | Admitting: Family

## 2020-03-07 ENCOUNTER — Ambulatory Visit (INDEPENDENT_AMBULATORY_CARE_PROVIDER_SITE_OTHER): Payer: Self-pay | Admitting: Family

## 2020-03-07 DIAGNOSIS — M869 Osteomyelitis, unspecified: Secondary | ICD-10-CM

## 2020-03-07 MED ORDER — GABAPENTIN 100 MG PO CAPS: 100.0000 mg | ORAL_CAPSULE | Freq: Three times a day (TID) | ORAL | 0 refills | Status: DC

## 2020-03-07 NOTE — Progress Notes (Signed)
   Post-Op Visit Note   Patient: Alison Gates           Date of Birth: 06/18/1975           MRN: [id number] Visit Date: 03/07/2020 PCP: Patient, No Pcp Per  Chief Complaint:  Chief Complaint  Patient presents with  . Right Foot - Routine Post Op    01/26/20 right foot 5th toe amputation     HPI:  HPI The patient is a 44 year old woman seen status post right fifth ray amputation she has been minimizing her weightbearing in a postop shoe doing daily Dial soap cleansing and dry dressing changes.  She continues out of work.  Ortho Exam On examination of the.  Right foot she does have an area of ulceration along her incision this is 4 cm in length about 8 mm wide this is fully filled in with fibrinous tissue as well as some eschar there is no surrounding maceration no erythema no drainage or warmth  Visit Diagnoses:  1. Osteomyelitis of fifth toe of right foot (HCC)     Plan: Continue daily Dial soap cleansing antibacterial ointment dressing changes.  She will follow-up in the office in 2 to 3 weeks. reevaluate return to work at that time  Follow-Up Instructions: Return in about 2 weeks (around 03/21/2020).   Imaging: No results found.  Orders:  No orders of the defined types were placed in this encounter.  No orders of the defined types were placed in this encounter.    PMFS History: Patient Active Problem List   Diagnosis Date Noted  . Osteomyelitis of fifth toe of right foot (HCC) 01/25/2020  . Diabetes mellitus type II, uncontrolled (HCC) 01/25/2020   Past Medical History:  Diagnosis Date  . Diabetes (HCC) 10/2018    History reviewed. No pertinent family history.  Past Surgical History:  Procedure Laterality Date  . AMPUTATION Right 01/26/2020   Procedure: RIGHT FOOT LITTLE TOE AMPUTATION;  Surgeon: [name], MD;  Location: [facility] OR;  Service: Orthopedics;  Laterality: Right;   Social History   Occupational History  . Not on file  Tobacco Use  . Smoking  status: Never Smoker  . Smokeless tobacco: Never Used  Substance and Sexual Activity  . Alcohol use: Not Currently  . Drug use: Not Currently  . Sexual activity: Not Currently

## 2020-03-08 ENCOUNTER — Encounter: Payer: Self-pay | Admitting: Family

## 2020-03-08 ENCOUNTER — Telehealth: Payer: Self-pay | Admitting: Family

## 2020-03-08 NOTE — Telephone Encounter (Signed)
Forms received from [facility]. Sent to [employer]

## 2020-03-21 ENCOUNTER — Encounter: Payer: Self-pay | Admitting: Orthopedic Surgery

## 2020-03-21 ENCOUNTER — Ambulatory Visit (INDEPENDENT_AMBULATORY_CARE_PROVIDER_SITE_OTHER): Payer: Self-pay | Admitting: Physician Assistant

## 2020-03-21 VITALS — Ht 61.0 in | Wt 180.0 lb

## 2020-03-21 DIAGNOSIS — M869 Osteomyelitis, unspecified: Secondary | ICD-10-CM

## 2020-03-21 NOTE — Progress Notes (Signed)
Office Visit Note   Patient: Alison Gates           Date of Birth: 20-Jul-1975           MRN: [id number] Visit Date: 03/21/2020              Requested by: No referring provider defined for this encounter. PCP: Patient, No Pcp Per  Chief Complaint  Patient presents with  . Right Foot - Routine Post Op    01/26/20 5th toe amputation       HPI: This is a pleasant Spanish-speaking only woman who is 6 weeks status post right fifth toe amputation.  She has been full weightbearing in a regular shoe.  She still complains of some pain and sensitivity around the scar  Assessment & Plan: Visit Diagnoses: No diagnosis found.  Plan: She will continue to work on desensitization.  She will follow-up in a month for evaluation to return to work.  She does work at a [employer] and is on her feet for long periods of time  Follow-Up Instructions: No follow-ups on file.   Ortho Exam  Patient is alert, oriented, no adenopathy, well-dressed, normal affect, normal respiratory effort. Right foot.  Mild soft tissue swelling no erythema she does have some sensitivity to light touch she does have a thickened eschar no surrounding cellulitis or drainage  Imaging: No results found. No images are attached to the encounter.  Labs: Lab Results  Component Value Date   HGBA1C 11.0 (H) 01/25/2020   ESRSEDRATE 86 (H) 01/27/2020   ESRSEDRATE 96 (H) 01/26/2020   ESRSEDRATE 87 (H) 01/25/2020   CRP 4.9 (H) 01/27/2020   CRP 8.6 (H) 01/26/2020   CRP 10.3 (H) 01/25/2020   REPTSTATUS 01/30/2020 FINAL 01/25/2020   CULT  01/25/2020    NO GROWTH 5 DAYS Performed at [facility], [address], [place], [place] [address]      Lab Results  Component Value Date   ALBUMIN 3.3 (L) 01/24/2020   PREALBUMIN 12.1 (L) 01/25/2020    No results found for: MG No results found for: VD25OH  Lab Results  Component Value Date   PREALBUMIN 12.1 (L) 01/25/2020   CBC EXTENDED Latest Ref Rng &  Units 01/27/2020 01/26/2020 01/25/2020  WBC 4.0 - 10.5 K/uL 8.7 9.6 9.3  RBC 3.87 - 5.11 MIL/uL 3.80(L) 4.06 3.97  HGB 12.0 - 15.0 g/dL 11.9(L) 12.4 12.2  HCT 36.0 - 46.0 % 34.7(L) 36.4 36.3  PLT 150 - 400 K/uL 285 271 252  NEUTROABS 1.7 - 7.7 K/uL 6.1 5.7 6.1  LYMPHSABS 0.7 - 4.0 K/uL 1.7 2.9 2.4     Body mass index is 34.01 kg/m.  Orders:  No orders of the defined types were placed in this encounter.  No orders of the defined types were placed in this encounter.    Procedures: No procedures performed  Clinical Data: No additional findings.  ROS:  All other systems negative, except as noted in the HPI. Review of Systems  Objective: Vital Signs: Ht 5\' 1"  (1.549 m)   Wt 180 lb (81.6 kg)   BMI 34.01 kg/m   Specialty Comments:  No specialty comments available.  PMFS History: Patient Active Problem List   Diagnosis Date Noted  . Osteomyelitis of fifth toe of right foot (HCC) 01/25/2020  . Diabetes mellitus type II, uncontrolled (HCC) 01/25/2020   Past Medical History:  Diagnosis Date  . Diabetes (HCC) 10/2018    No family history on  file.  Past Surgical History:  Procedure Laterality Date  . AMPUTATION Right 01/26/2020   Procedure: RIGHT FOOT LITTLE TOE AMPUTATION;  Surgeon: [name], MD;  Location: [facility] OR;  Service: Orthopedics;  Laterality: Right;   Social History   Occupational History  . Not on file  Tobacco Use  . Smoking status: Never Smoker  . Smokeless tobacco: Never Used  Substance and Sexual Activity  . Alcohol use: Not Currently  . Drug use: Not Currently  . Sexual activity: Not Currently

## 2020-04-18 ENCOUNTER — Encounter: Payer: Self-pay | Admitting: Physician Assistant

## 2020-04-18 ENCOUNTER — Ambulatory Visit (INDEPENDENT_AMBULATORY_CARE_PROVIDER_SITE_OTHER): Payer: Self-pay | Admitting: Physician Assistant

## 2020-04-18 DIAGNOSIS — M869 Osteomyelitis, unspecified: Secondary | ICD-10-CM

## 2020-04-18 MED ORDER — DOXYCYCLINE HYCLATE 100 MG PO TABS
100.0000 mg | ORAL_TABLET | Freq: Two times a day (BID) | ORAL | 0 refills | Status: DC
Start: 2020-04-18 — End: 2021-02-28

## 2020-04-18 MED ORDER — GABAPENTIN 100 MG PO CAPS
100.0000 mg | ORAL_CAPSULE | Freq: Three times a day (TID) | ORAL | 0 refills | Status: DC
Start: 2020-04-18 — End: 2021-03-04

## 2020-04-18 NOTE — Progress Notes (Signed)
Office Visit Note   Patient: Alison Gates           Date of Birth: Apr 24, 1975           MRN: [id number] Visit Date: 04/18/2020              Requested by: No referring provider defined for this encounter. PCP: Patient, No Pcp Per  No chief complaint on file.     HPI: Patient reported 1/72-month status post right foot amputation.  She is complaining of sensitivity over the area of the amputation site which she describes as tingling.  She also has sensitivity sometimes in her calf that makes it hard for her to lay her blanket on it at night.  She still gets a scant amount of bloody drainage from the wound.  She is planning on returning to work on January 20.  Assessment & Plan: Visit Diagnoses: No diagnosis found.  Plan: At one time she was prescribed gabapentin which she admits she did not pick up from the pharmacy.  I have represcribed that today.  Also have put her on a short course of doxycycline considering she still has a small amount of drainage at this point.  I cannot appreciate any acute infection and overall she looks good.  We will follow-up in 2 weeks.  Follow-Up Instructions: No follow-ups on file.   Ortho Exam  Patient is alert, oriented, no adenopathy, well-dressed, normal affect, normal respiratory effort. Focused examination of her foot demonstrates well-healed surgical incision.  She just has slight amount of bloody drainage.  There is no associated cellulitis or swelling.  No foul odor.  She is sensitive to touch over her calf.  She has a negative Homans' sign and states she really only has the tingling at night.  Cannot reproduce it today.  She denies calf pain  Imaging: No results found. No images are attached to the encounter.  Labs: Lab Results  Component Value Date   HGBA1C 11.0 (H) 01/25/2020   ESRSEDRATE 86 (H) 01/27/2020   ESRSEDRATE 96 (H) 01/26/2020   ESRSEDRATE 87 (H) 01/25/2020   CRP 4.9 (H) 01/27/2020   CRP 8.6 (H) 01/26/2020   CRP 10.3 (H)  01/25/2020   REPTSTATUS 01/30/2020 FINAL 01/25/2020   CULT  01/25/2020    NO GROWTH 5 DAYS Performed at [facility], [address] [place], [place] [address]      Lab Results  Component Value Date   ALBUMIN 3.3 (L) 01/24/2020   PREALBUMIN 12.1 (L) 01/25/2020    No results found for: MG No results found for: VD25OH  Lab Results  Component Value Date   PREALBUMIN 12.1 (L) 01/25/2020   CBC EXTENDED Latest Ref Rng & Units 01/27/2020 01/26/2020 01/25/2020  WBC 4.0 - 10.5 K/uL 8.7 9.6 9.3  RBC 3.87 - 5.11 MIL/uL 3.80(L) 4.06 3.97  HGB 12.0 - 15.0 g/dL 11.9(L) 12.4 12.2  HCT 36.0 - 46.0 % 34.7(L) 36.4 36.3  PLT 150 - 400 K/uL 285 271 252  NEUTROABS 1.7 - 7.7 K/uL 6.1 5.7 6.1  LYMPHSABS 0.7 - 4.0 K/uL 1.7 2.9 2.4     There is no height or weight on file to calculate BMI.  Orders:  No orders of the defined types were placed in this encounter.  Meds ordered this encounter  Medications  . gabapentin (NEURONTIN) 100 MG capsule    Sig: Take 1 capsule (100 mg total) by mouth 3 (three) times daily.    Dispense:  90 capsule  Refill:  0  . doxycycline (VIBRA-TABS) 100 MG tablet    Sig: Take 1 tablet (100 mg total) by mouth 2 (two) times daily.    Dispense:  30 tablet    Refill:  0     Procedures: No procedures performed  Clinical Data: No additional findings.  ROS:  All other systems negative, except as noted in the HPI. Review of Systems  Objective: Vital Signs: There were no vitals taken for this visit.  Specialty Comments:  No specialty comments available.  PMFS History: Patient Active Problem List   Diagnosis Date Noted  . Osteomyelitis of fifth toe of right foot (HCC) 01/25/2020  . Diabetes mellitus type II, uncontrolled (HCC) 01/25/2020   Past Medical History:  Diagnosis Date  . Diabetes (HCC) 10/2018    No family history on file.  Past Surgical History:  Procedure Laterality Date  . AMPUTATION Right 01/26/2020   Procedure: RIGHT FOOT  LITTLE TOE AMPUTATION;  Surgeon: [name], MD;  Location: [facility] OR;  Service: Orthopedics;  Laterality: Right;   Social History   Occupational History  . Not on file  Tobacco Use  . Smoking status: Never Smoker  . Smokeless tobacco: Never Used  Substance and Sexual Activity  . Alcohol use: Not Currently  . Drug use: Not Currently  . Sexual activity: Not Currently

## 2020-05-02 ENCOUNTER — Encounter: Payer: Self-pay | Admitting: Physician Assistant

## 2020-05-02 ENCOUNTER — Ambulatory Visit (INDEPENDENT_AMBULATORY_CARE_PROVIDER_SITE_OTHER): Payer: Self-pay | Admitting: Physician Assistant

## 2020-05-02 DIAGNOSIS — M869 Osteomyelitis, unspecified: Secondary | ICD-10-CM

## 2020-05-02 NOTE — Progress Notes (Signed)
Office Visit Note   Patient: Alison Gates           Date of Birth: 01-15-1976           MRN: [id number] Visit Date: 05/02/2020              Requested by: No referring provider defined for this encounter. PCP: Patient, No Pcp Per  Chief Complaint  Patient presents with  . Right Foot - Routine Post Op    01/26/20 right foot 5th toe amputation       HPI: Patient is 3 months status post right foot fifth toe amputation.  She is in a regular show and full weightbearing.  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  Her only complaint is when she is on her feet for a long period of time she gets some swelling which is not painful in her calf  Assessment & Plan: Visit Diagnoses: No diagnosis found.  Plan: I have given her extra-large compression sleeve that seem to fit well.  I told her to wear these when she is working.  May follow-up as needed or if any concerns arise  Follow-Up Instructions: No follow-ups on file.   Ortho Exam  Patient is alert, oriented, no adenopathy, well-dressed, normal affect, normal respiratory effort. Right foot well-healed surgical incision she has a small amount of eschar but no drainage.  No erythema swelling is well controlled on her foot.  Her calf she has no palpable cord she is nontender even to deep palpation she has a negative Homans' sign no ascending cellulitis  Imaging: No results found. No images are attached to the encounter.  Labs: Lab Results  Component Value Date   HGBA1C 11.0 (H) 01/25/2020   ESRSEDRATE 86 (H) 01/27/2020   ESRSEDRATE 96 (H) 01/26/2020   ESRSEDRATE 87 (H) 01/25/2020   CRP 4.9 (H) 01/27/2020   CRP 8.6 (H) 01/26/2020   CRP 10.3 (H) 01/25/2020   REPTSTATUS 01/30/2020 FINAL 01/25/2020   CULT  01/25/2020    NO GROWTH 5 DAYS Performed at [facility], [address], [place], [place] [address]      Lab Results  Component Value Date   ALBUMIN 3.3 (L) 01/24/2020    PREALBUMIN 12.1 (L) 01/25/2020    No results found for: MG No results found for: VD25OH  Lab Results  Component Value Date   PREALBUMIN 12.1 (L) 01/25/2020   CBC EXTENDED Latest Ref Rng & Units 01/27/2020 01/26/2020 01/25/2020  WBC 4.0 - 10.5 K/uL 8.7 9.6 9.3  RBC 3.87 - 5.11 MIL/uL 3.80(L) 4.06 3.97  HGB 12.0 - 15.0 g/dL 11.9(L) 12.4 12.2  HCT 36.0 - 46.0 % 34.7(L) 36.4 36.3  PLT 150 - 400 K/uL 285 271 252  NEUTROABS 1.7 - 7.7 K/uL 6.1 5.7 6.1  LYMPHSABS 0.7 - 4.0 K/uL 1.7 2.9 2.4     There is no height or weight on file to calculate BMI.  Orders:  No orders of the defined types were placed in this encounter.  No orders of the defined types were placed in this encounter.    Procedures: No procedures performed  Clinical Data: No additional findings.  ROS:  All other systems negative, except as noted in the HPI. Review of Systems  Objective: Vital Signs: There were no vitals taken for this visit.  Specialty Comments:  No specialty comments available.  PMFS History: Patient Active Problem List   Diagnosis Date  Noted  . Osteomyelitis of fifth toe of right foot (HCC) 01/25/2020  . Diabetes mellitus type II, uncontrolled (HCC) 01/25/2020   Past Medical History:  Diagnosis Date  . Diabetes (HCC) 10/2018    No family history on file.  Past Surgical History:  Procedure Laterality Date  . AMPUTATION Right 01/26/2020   Procedure: RIGHT FOOT LITTLE TOE AMPUTATION;  Surgeon: [name], MD;  Location: [facility] OR;  Service: Orthopedics;  Laterality: Right;   Social History   Occupational History  . Not on file  Tobacco Use  . Smoking status: Never Smoker  . Smokeless tobacco: Never Used  Substance and Sexual Activity  . Alcohol use: Not Currently  . Drug use: Not Currently  . Sexual activity: Not Currently

## 2021-02-27 ENCOUNTER — Emergency Department (HOSPITAL_COMMUNITY): Payer: 59

## 2021-02-27 ENCOUNTER — Inpatient Hospital Stay (HOSPITAL_COMMUNITY)
Admission: EM | Admit: 2021-02-27 | Discharge: 2021-03-04 | DRG: 854 | Disposition: A | Discharge status: Home / Self Care | Payer: 59 | Attending: Internal Medicine | Admitting: Internal Medicine

## 2021-02-27 ENCOUNTER — Other Ambulatory Visit: Payer: Self-pay

## 2021-02-27 ENCOUNTER — Encounter (HOSPITAL_COMMUNITY): Payer: Self-pay | Admitting: Internal Medicine

## 2021-02-27 DIAGNOSIS — Z7984 Long term (current) use of oral hypoglycemic drugs: Secondary | ICD-10-CM

## 2021-02-27 DIAGNOSIS — L0390 Cellulitis, unspecified: Secondary | ICD-10-CM

## 2021-02-27 DIAGNOSIS — L03031 Cellulitis of right toe: Secondary | ICD-10-CM | POA: Diagnosis present

## 2021-02-27 DIAGNOSIS — Z79899 Other long term (current) drug therapy: Secondary | ICD-10-CM

## 2021-02-27 DIAGNOSIS — R059 Cough, unspecified: Secondary | ICD-10-CM | POA: Diagnosis present

## 2021-02-27 DIAGNOSIS — E11628 Type 2 diabetes mellitus with other skin complications: Secondary | ICD-10-CM | POA: Diagnosis present

## 2021-02-27 DIAGNOSIS — I70201 Unspecified atherosclerosis of native arteries of extremities, right leg: Secondary | ICD-10-CM | POA: Diagnosis present

## 2021-02-27 DIAGNOSIS — E1165 Type 2 diabetes mellitus with hyperglycemia: Secondary | ICD-10-CM

## 2021-02-27 DIAGNOSIS — Z91199 Patient's noncompliance with other medical treatment and regimen due to unspecified reason: Secondary | ICD-10-CM | POA: Diagnosis not present

## 2021-02-27 DIAGNOSIS — E1151 Type 2 diabetes mellitus with diabetic peripheral angiopathy without gangrene: Secondary | ICD-10-CM | POA: Diagnosis present

## 2021-02-27 DIAGNOSIS — E1169 Type 2 diabetes mellitus with other specified complication: Secondary | ICD-10-CM | POA: Diagnosis present

## 2021-02-27 DIAGNOSIS — L089 Local infection of the skin and subcutaneous tissue, unspecified: Secondary | ICD-10-CM

## 2021-02-27 DIAGNOSIS — E11621 Type 2 diabetes mellitus with foot ulcer: Secondary | ICD-10-CM | POA: Diagnosis present

## 2021-02-27 DIAGNOSIS — L97519 Non-pressure chronic ulcer of other part of right foot with unspecified severity: Secondary | ICD-10-CM | POA: Diagnosis present

## 2021-02-27 DIAGNOSIS — L02612 Cutaneous abscess of left foot: Secondary | ICD-10-CM | POA: Diagnosis present

## 2021-02-27 DIAGNOSIS — Z9114 Patient's other noncompliance with medication regimen: Secondary | ICD-10-CM | POA: Diagnosis not present

## 2021-02-27 DIAGNOSIS — L02611 Cutaneous abscess of right foot: Secondary | ICD-10-CM | POA: Diagnosis present

## 2021-02-27 DIAGNOSIS — M869 Osteomyelitis, unspecified: Secondary | ICD-10-CM | POA: Diagnosis present

## 2021-02-27 DIAGNOSIS — L97529 Non-pressure chronic ulcer of other part of left foot with unspecified severity: Secondary | ICD-10-CM | POA: Diagnosis present

## 2021-02-27 DIAGNOSIS — I739 Peripheral vascular disease, unspecified: Secondary | ICD-10-CM

## 2021-02-27 DIAGNOSIS — Z20822 Contact with and (suspected) exposure to covid-19: Secondary | ICD-10-CM | POA: Diagnosis present

## 2021-02-27 DIAGNOSIS — Z28311 Partially vaccinated for covid-19: Secondary | ICD-10-CM | POA: Diagnosis not present

## 2021-02-27 DIAGNOSIS — L03032 Cellulitis of left toe: Secondary | ICD-10-CM | POA: Diagnosis present

## 2021-02-27 DIAGNOSIS — A419 Sepsis, unspecified organism: Principal | ICD-10-CM | POA: Diagnosis present

## 2021-02-27 LAB — CBC WITH DIFFERENTIAL/PLATELET
Abs Immature Granulocytes: 0.07 10*3/uL (ref 0.00–0.07)
Basophils Absolute: 0 10*3/uL (ref 0.0–0.1)
Basophils Relative: 0 %
Eosinophils Absolute: 0 10*3/uL (ref 0.0–0.5)
Eosinophils Relative: 0 %
HCT: 37.5 % (ref 36.0–46.0)
Hemoglobin: 12.7 g/dL (ref 12.0–15.0)
Immature Granulocytes: 1 %
Lymphocytes Relative: 9 %
Lymphs Abs: 1.4 10*3/uL (ref 0.7–4.0)
MCH: 30.8 pg (ref 26.0–34.0)
MCHC: 33.9 g/dL (ref 30.0–36.0)
MCV: 90.8 fL (ref 80.0–100.0)
Monocytes Absolute: 0.9 10*3/uL (ref 0.1–1.0)
Monocytes Relative: 6 %
Neutro Abs: 12.8 10*3/uL — ABNORMAL HIGH (ref 1.7–7.7)
Neutrophils Relative %: 84 %
Platelets: 324 10*3/uL (ref 150–400)
RBC: 4.13 MIL/uL (ref 3.87–5.11)
RDW: 11.5 % (ref 11.5–15.5)
WBC: 15.2 10*3/uL — ABNORMAL HIGH (ref 4.0–10.5)
nRBC: 0 % (ref 0.0–0.2)

## 2021-02-27 LAB — LACTIC ACID, PLASMA
Lactic Acid, Venous: 2.1 mmol/L (ref 0.5–1.9)
Lactic Acid, Venous: 1.1 mmol/L (ref 0.5–1.9)

## 2021-02-27 LAB — CBG MONITORING, ED
Glucose-Capillary: 232 mg/dL — ABNORMAL HIGH (ref 70–99)
Glucose-Capillary: 236 mg/dL — ABNORMAL HIGH (ref 70–99)

## 2021-02-27 LAB — BASIC METABOLIC PANEL
Anion gap: 9 (ref 5–15)
BUN: 10 mg/dL (ref 6–20)
CO2: 27 mmol/L (ref 22–32)
Calcium: 9.2 mg/dL (ref 8.9–10.3)
Chloride: 94 mmol/L — ABNORMAL LOW (ref 98–111)
Creatinine, Ser: 0.69 mg/dL (ref 0.44–1.00)
GFR, Estimated: >60 mL/min (ref >60)
Glucose, Bld: 501 mg/dL (ref 70–99)
Potassium: 4.6 mmol/L (ref 3.5–5.1)
Sodium: 130 mmol/L — ABNORMAL LOW (ref 135–145)

## 2021-02-27 IMAGING — DX DG FOOT COMPLETE 3+V*L*
3 series · 3 of 3 positions shown · non-contrast
Comparison: None.

CLINICAL DATA: 45-year-old female with a great toe wound

EXAM:
LEFT FOOT - COMPLETE 3+ VIEW

[foot ap]
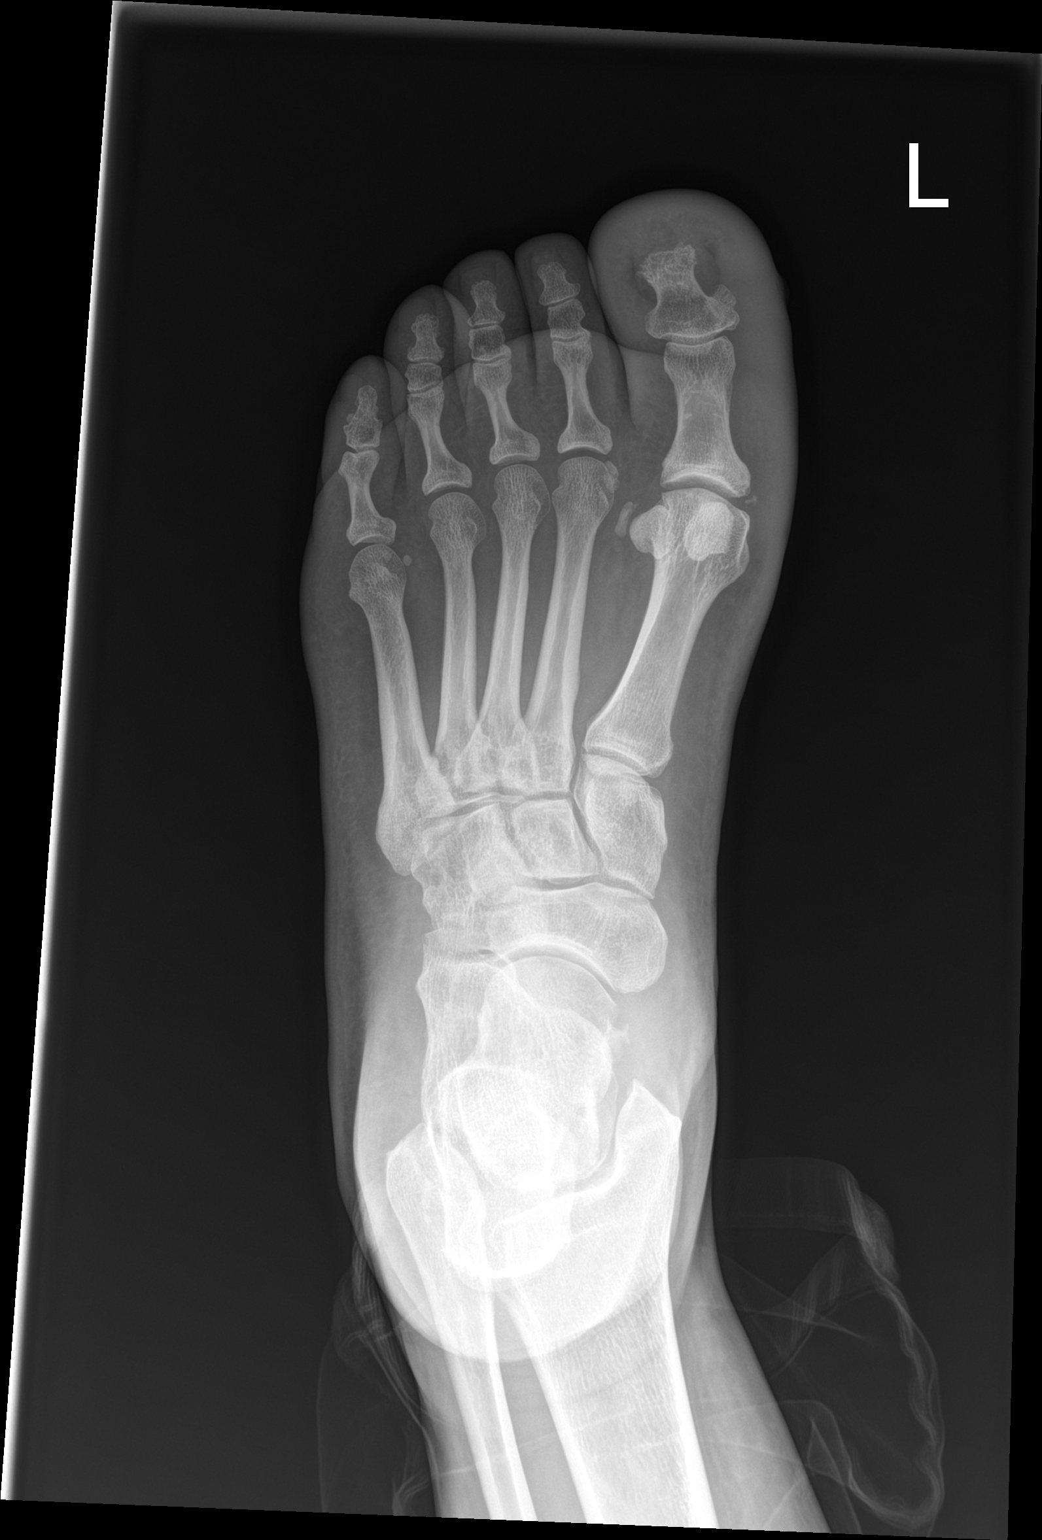

[foot obl]
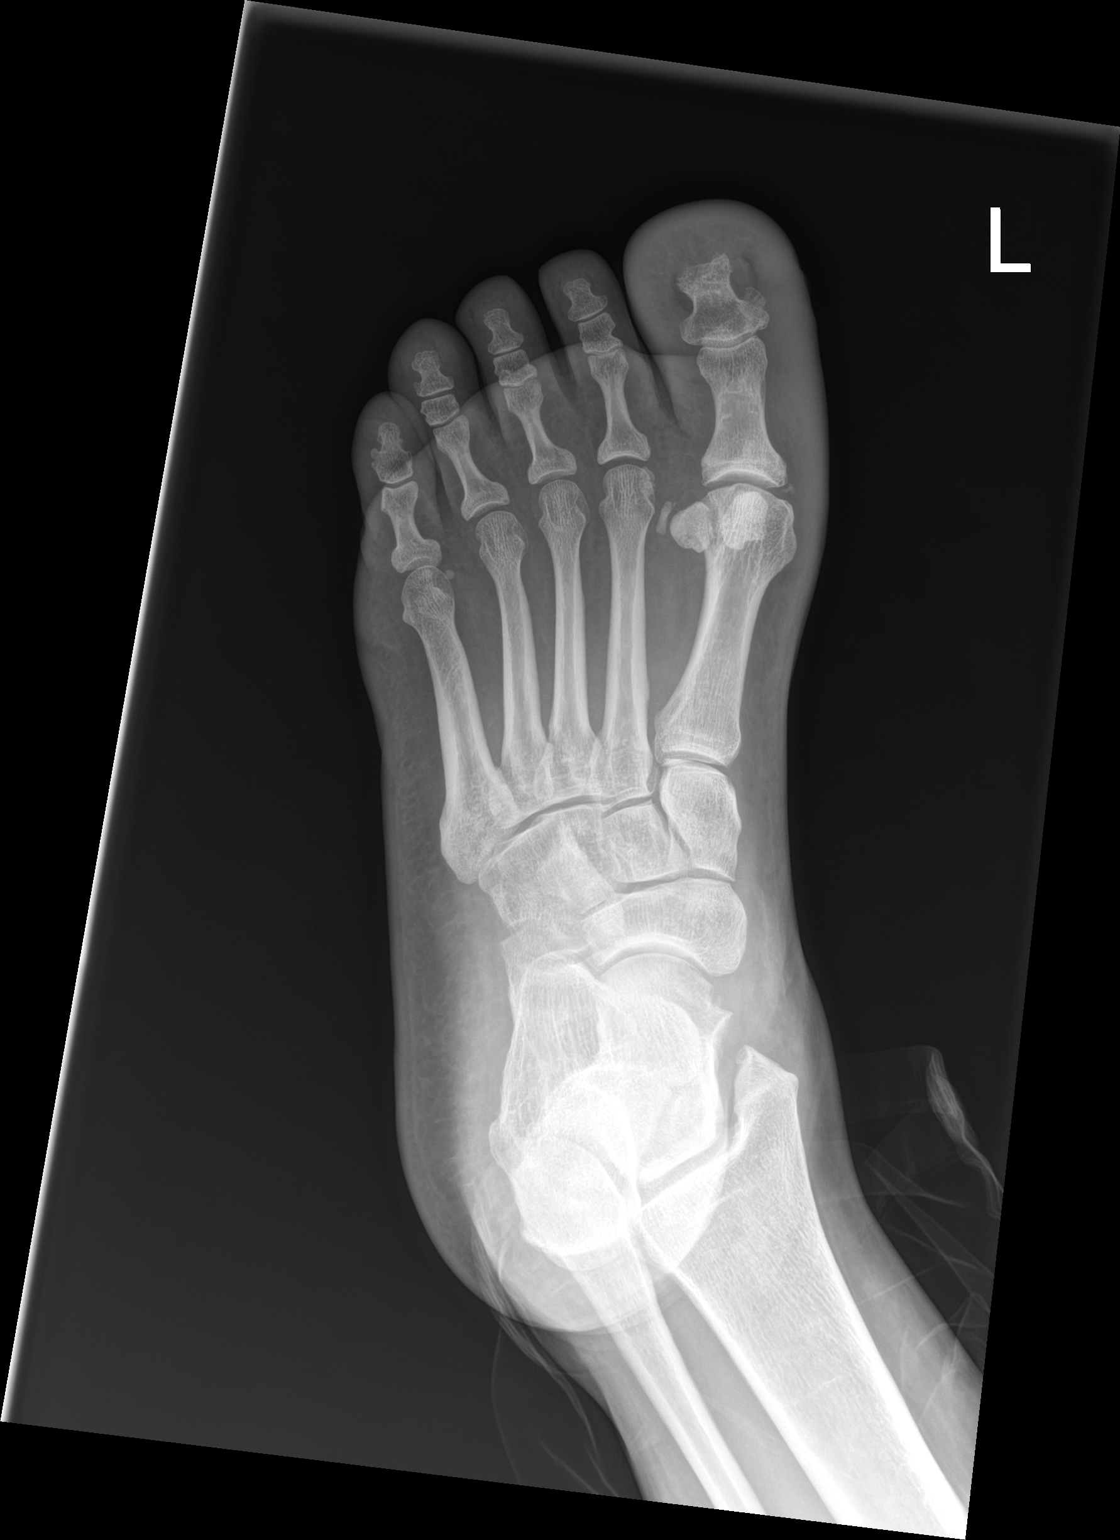

[foot lat]
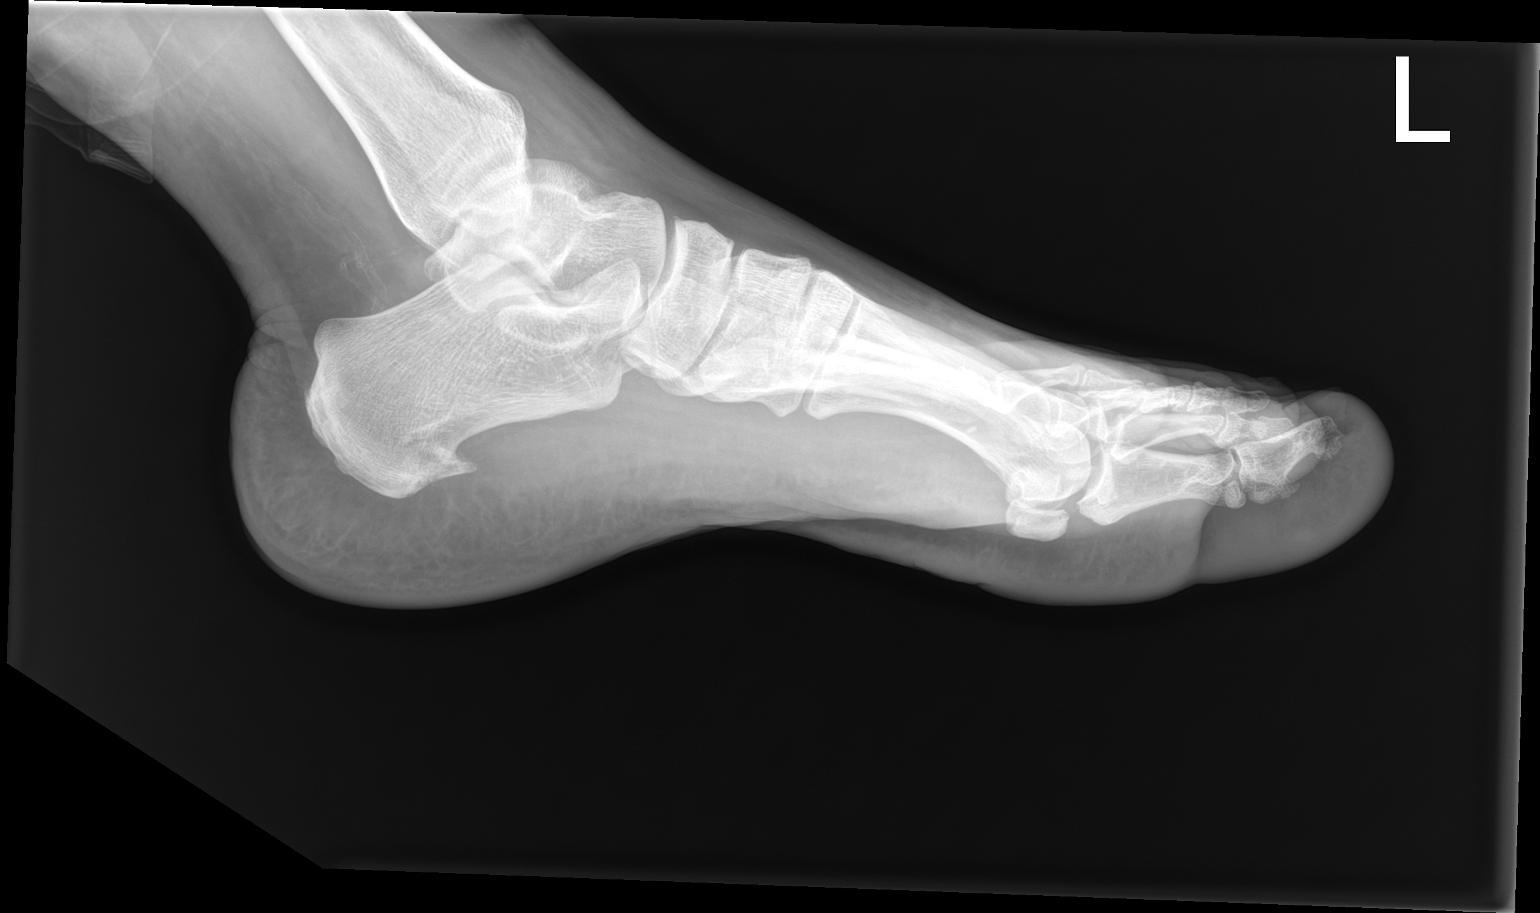

[3 of 3 positions shown; findings below may reference images not displayed]

FINDINGS: No acute displaced fracture.  No erosive changes of the first digit.

No subluxation/dislocation.

Soft tissue swelling of the great toe.  No subcutaneous gas.

No radiopaque foreign body.

Medial calcinosis of the tibial arteries.
IMPRESSION: Soft tissue swelling of the great toe compatible with cellulitis,
with no evidence of osteomyelitis on the plain film.

Tibial artery atherosclerosis

## 2021-02-27 IMAGING — CR DG FOOT 2V*R*
2 series · 2 of 2 positions shown · non-contrast
Comparison: X-ray right foot [DATE]

CLINICAL DATA: fourth toe swelling and injury. evaluate for
osteomyelitis

EXAM:
RIGHT FOOT - 2 VIEW

[foot ap]
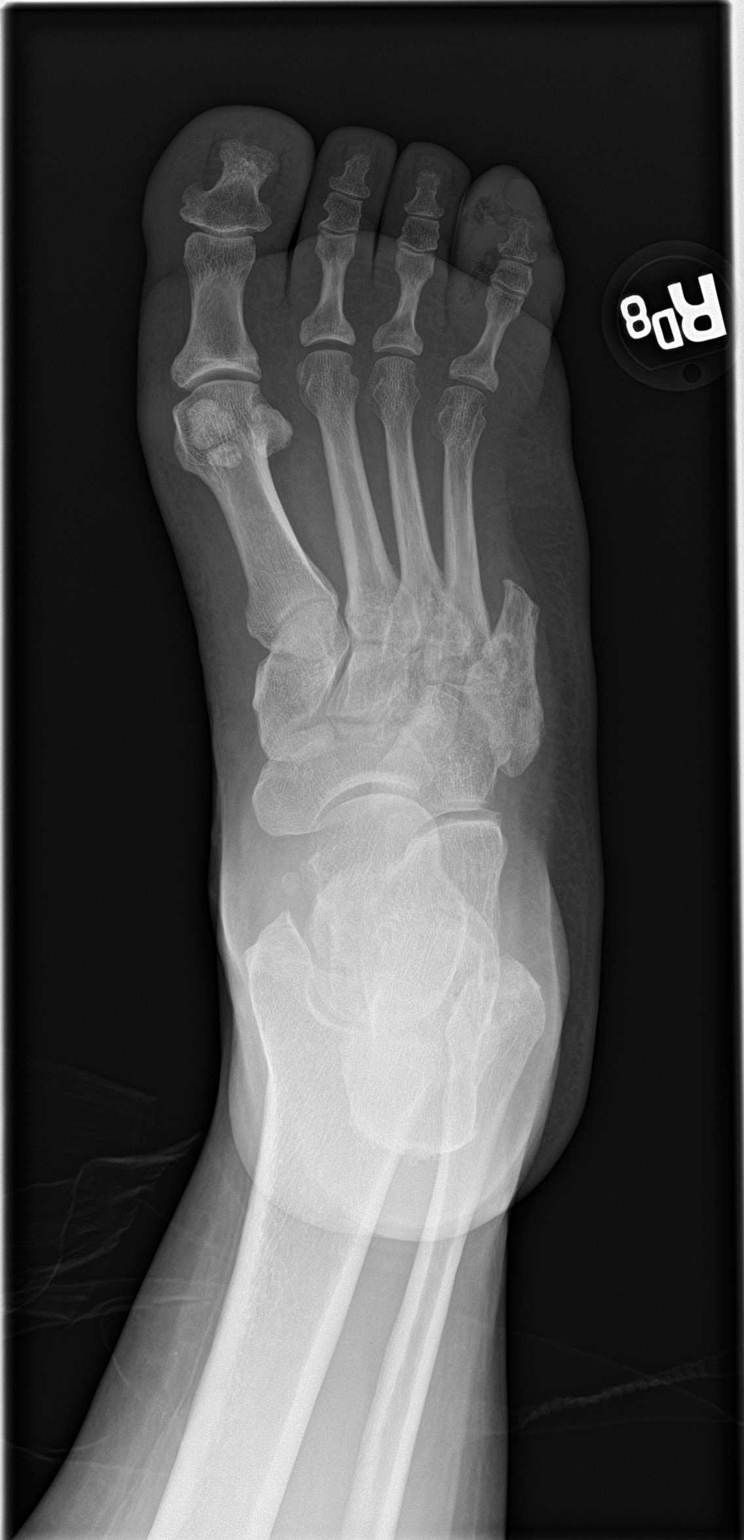

[foot lat]
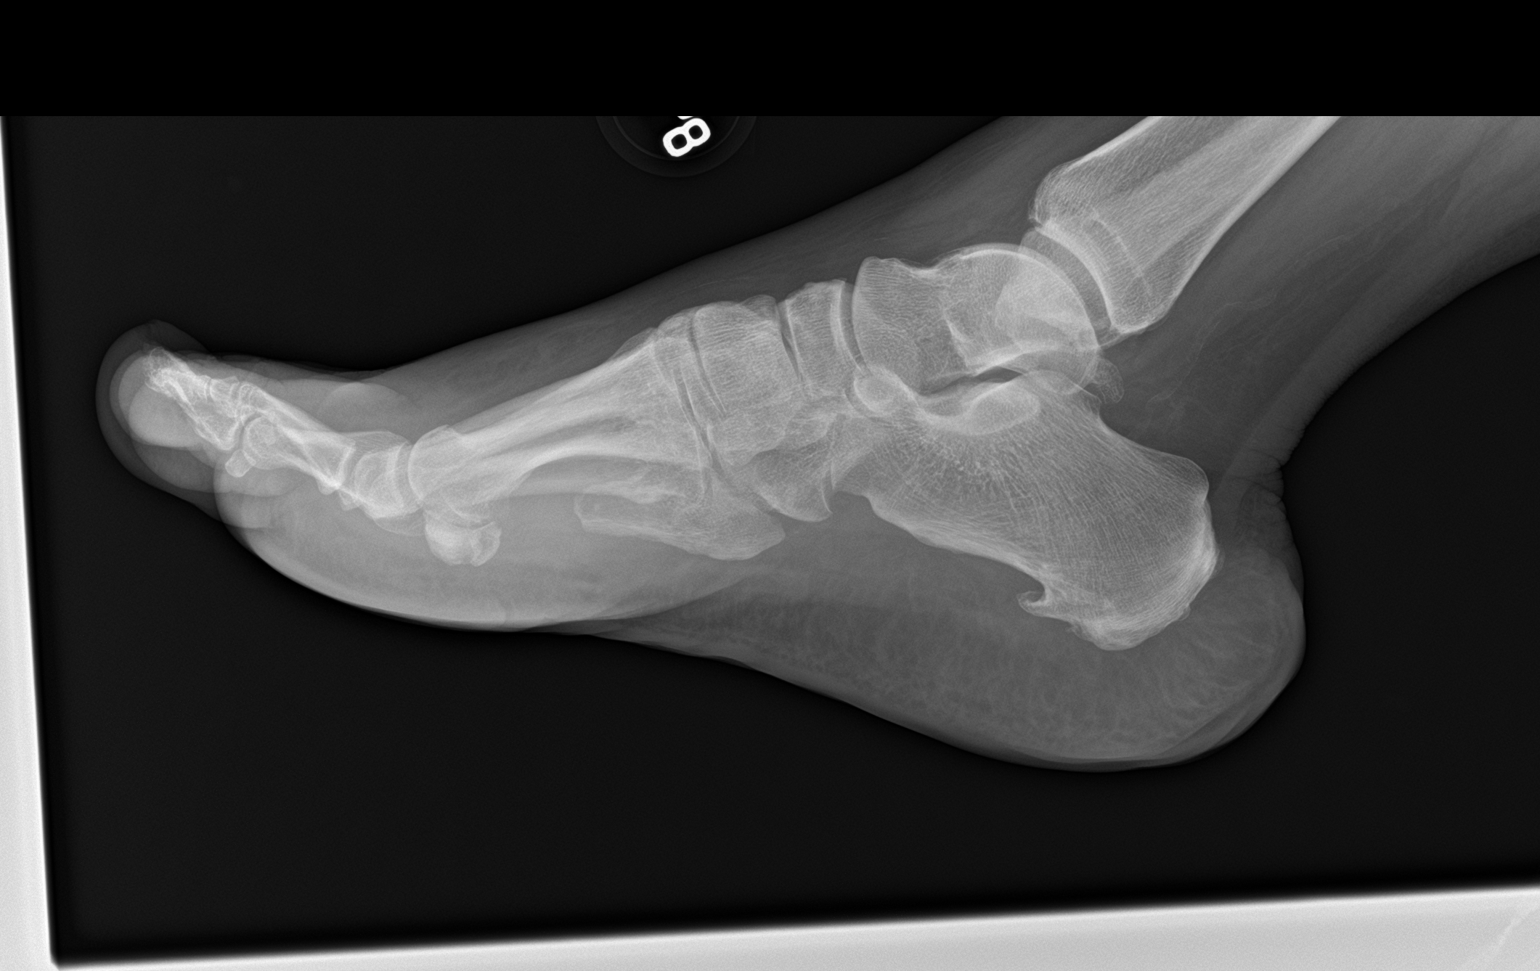

[2 of 2 positions shown; findings below may reference images not displayed]

FINDINGS: Right fifth metatarsal amputation-Clear margins, no definite
cortical erosion or destruction.

Subcutaneus soft tissue edema and emphysema along the distal fourth
digit with query trace cortical erosion of the phalangeal
tuft-limited evaluation with no oblique view obtained.

There is no evidence of fracture or dislocation.

No severe degenerative changes.
IMPRESSION: Subcutaneus soft tissue edema and emphysema along the distal fourth
digit with query trace cortical erosion of the phalangeal
tuft-limited evaluation with no oblique view obtained. Consider
further evaluation with an oblique view of the foot versus MRI for
further evaluation (with intravenous contrast if GFR greater than
30).

## 2021-02-27 MED ORDER — SODIUM CHLORIDE 0.9 % IV SOLN
2.0000 g | Freq: Once | INTRAVENOUS | Status: AC
  Administered 2021-02-27: 2 g via INTRAVENOUS
  Filled 2021-02-27: qty 20

## 2021-02-27 MED ORDER — VANCOMYCIN HCL 1500 MG/300ML IV SOLN
1500.0000 mg | Freq: Once | INTRAVENOUS | Status: AC
  Administered 2021-02-27: 1500 mg via INTRAVENOUS
  Filled 2021-02-27: qty 300

## 2021-02-27 MED ORDER — INSULIN ASPART 100 UNIT/ML IJ SOLN
12.0000 [IU] | Freq: Once | INTRAMUSCULAR | Status: AC
  Administered 2021-02-27: 12 [IU] via SUBCUTANEOUS

## 2021-02-27 MED ORDER — SODIUM CHLORIDE 0.9 % IV BOLUS
1000.0000 mL | Freq: Once | INTRAVENOUS | Status: AC
  Administered 2021-02-27: 1000 mL via INTRAVENOUS

## 2021-02-27 NOTE — ED Provider Notes (Signed)
[facility] EMERGENCY DEPARTMENT Provider Note   CSN: [id number] Arrival date & time: 02/27/21  1559     History Chief Complaint  Patient presents with   Toe Swelling    Alison Gates is a 45 y.o. female.  45 year old female presents today for evaluation of left great toe swelling and pain of 1 week duration that significantly worsened since Friday.  She denies fever, chills.  She does not recall injuring the toe.  She does have history of diabetes but has not been compliant with her diabetic regimen for the past 2 weeks.  She reports she is not supposed to be on insulin.  She is unsure of her daily medications.  She does have a history of osteomyelitis of fifth toe of right foot for which she is status post amputation.  On exam she is also found to have swelling and injury to fourth toe of her right foot.  She states this has been ongoing for same duration and relates that to her shoe wear.  The history is provided by the patient. No language interpreter was used.      Past Medical History:  Diagnosis Date   Diabetes (HCC) 10/2018    Patient Active Problem List   Diagnosis Date Noted   Osteomyelitis of fifth toe of right foot (HCC) 01/25/2020   Diabetes mellitus type II, uncontrolled 01/25/2020    Past Surgical History:  Procedure Laterality Date   AMPUTATION Right 01/26/2020   Procedure: RIGHT FOOT LITTLE TOE AMPUTATION;  Surgeon: [name], MD;  Location: [facility] OR;  Service: Orthopedics;  Laterality: Right;     OB History   No obstetric history on file.     No family history on file.  Social History   Tobacco Use   Smoking status: Never   Smokeless tobacco: Never  Substance Use Topics   Alcohol use: Not Currently   Drug use: Not Currently    Home Medications Prior to Admission medications   Medication Sig Start Date End Date Taking? Authorizing Provider  doxycycline (VIBRA-TABS) 100 MG tablet Take 1 tablet (100 mg total) by mouth 2  (two) times daily. 04/18/20   Persons, [place], PA  gabapentin (NEURONTIN) 100 MG capsule Take 1 capsule (100 mg total) by mouth 3 (three) times daily. 04/18/20   Persons, [place], PA  glipiZIDE (GLUCOTROL) 5 MG tablet TAKE 1 TABLET (5 MG TOTAL) BY MOUTH TWO TIMES DAILY. 01/27/20 01/26/21  Dorcas Carrow, MD  ibuprofen (ADVIL) 200 MG tablet Take 400 mg by mouth every 6 (six) hours as needed for fever, headache or mild pain.    [provider]  metFORMIN (GLUCOPHAGE) 500 MG tablet TAKE 1 TABLET (500 MG TOTAL) BY MOUTH TWO TIMES DAILY WITH A MEAL. 01/27/20 01/26/21  Dorcas Carrow, MD  vitamin B-12 (CYANOCOBALAMIN) 500 MCG tablet Take 500 mcg by mouth daily.    [provider]    Allergies    Patient has no known allergies.  Review of Systems   Review of Systems  Constitutional:  Negative for activity change, chills and fever.  Gastrointestinal:  Negative for nausea.  Musculoskeletal:  Negative for back pain and gait problem.  Skin:  Positive for wound.  Neurological:  Negative for numbness.  All other systems reviewed and are negative.  Physical Exam Updated Vital Signs BP 136/81 (BP Location: Left Arm)   Pulse (!) 103   Temp 97.9 F (36.6 C)   Resp 17   Ht 5\' 2"  (1.575  m)   Wt 77.1 kg   SpO2 100%   BMI 31.09 kg/m   Physical Exam Vitals and nursing note reviewed.  Constitutional:      General: She is not in acute distress.    Appearance: Normal appearance. She is not ill-appearing.  HENT:     Head: Normocephalic and atraumatic.     Nose: Nose normal.  Eyes:     General: No scleral icterus.    Extraocular Movements: Extraocular movements intact.     Conjunctiva/sclera: Conjunctivae normal.  Cardiovascular:     Rate and Rhythm: Regular rhythm. Tachycardia present.     Pulses: Normal pulses.     Heart sounds: Normal heart sounds.  Pulmonary:     Effort: Pulmonary effort is normal. No respiratory distress.     Breath sounds: Normal breath sounds. No  wheezing or rales.  Abdominal:     General: There is no distension.     Palpations: Abdomen is soft.     Tenderness: There is no abdominal tenderness. There is no guarding.  Musculoskeletal:        General: Normal range of motion.     Cervical back: Normal range of motion.     Comments: Patient's status post right foot fifth toe amputation.  Patient does have visible swelling of fourth toe on the right foot, great toe on left foot with significant swelling.  Patient with full range of motion in all of her toes as well as bilateral ankles.  Significant tenderness to palpation present over left great toe.  Sensation in bilateral feet intact and symmetrical.  Skin:    General: Skin is warm and dry.  Neurological:     General: No focal deficit present.     Mental Status: She is alert. Mental status is at baseline.             ED Results / Procedures / Treatments   Labs (all labs ordered are listed, but only abnormal results are displayed) Labs Reviewed  CBC WITH DIFFERENTIAL/PLATELET  BASIC METABOLIC PANEL    EKG None  Radiology DG Foot Complete Left  Result Date: 02/27/2021 CLINICAL DATA:  45 year old female with a great toe wound EXAM: LEFT FOOT - COMPLETE 3+ VIEW COMPARISON:  None. FINDINGS: No acute displaced fracture.  No erosive changes of the first digit. No subluxation/dislocation. Soft tissue swelling of the great toe.  No subcutaneous gas. No radiopaque foreign body. Medial calcinosis of the tibial arteries. IMPRESSION: Soft tissue swelling of the great toe compatible with cellulitis, with no evidence of osteomyelitis on the plain film. Tibial artery atherosclerosis Electronically Signed   By: Gilmer Mor D.O.   On: 02/27/2021 16:33    Procedures Procedures   Medications Ordered in ED Medications - No data to display  ED Course  I have reviewed the triage vital signs and the nursing notes.  Pertinent labs & imaging results that were available during my care  of the patient were reviewed by me and considered in my medical decision making (see chart for details).    MDM Rules/Calculators/A&P                           44 year old female with history of uncontrolled diabetes status post amputation of her right fifth 1 year ago presents today for evaluation of great toe of her left foot with significant swelling, pain of 1 week duration and fourth toe of her right foot with swelling and injury.  Patient is tachycardic but denies fever, chills.  Initial work-up is significant for left foot x-ray without signs of osteomyelitis, but with presence of significant soft tissue swelling consistent with cellulitis.  Will obtain blood cultures, lactic acid, CBC, BMP, provide IV fluids, IV antibiotics.  We will also obtain right foot x-ray.  Right foot x-ray with questionable concern for erosion/osteomyelitis.  Will order MRI right foot to better evaluate.  We will give IV vancomycin.  Initially patient is with lactic acidosis which later cleared following IV fluids.  Patient remains mildly tachycardic.  But without other complaints.  2330: Still awaiting MRI.  Case discussed with hospitalist given concern for osteomyelitis and uncontrolled diabetes for admission and further work-up.  Hospitalist agreed and will evaluate patient for admission.  Final Clinical Impression(s) / ED Diagnoses Final diagnoses:  None    Rx / DC Orders ED Discharge Orders     None        Marita [place], PA-C 02/27/21 2349    Ernie Avena, MD 02/28/21 0001

## 2021-02-27 NOTE — ED Notes (Signed)
Lactic 2.1 primary RN aware

## 2021-02-27 NOTE — ED Provider Notes (Signed)
Emergency Medicine Provider Triage Evaluation Note  Alison Gates , a 45 y.o. female  was evaluated in triage.  Interpreter used for triage.  Pt complains of pain to the left great toe.  She states that she started noticing itching about a week ago.  She states that since then she has had swelling and pain in the left great toe.  She notes that she has had puslike drainage from the toe.  Endorses history of diabetes.  She states she had tactile fever at home.  Denies nausea or vomiting.  Review of Systems  Positive: See above Negative:   Physical Exam  BP 136/81 (BP Location: Left Arm)   Pulse (!) 103   Temp 97.9 F (36.6 C)   Resp 17   Ht 5\' 2"  (1.575 m)   Wt 77.1 kg   SpO2 100%   BMI 31.09 kg/m  Gen:   Awake, no distress   Resp:  Normal effort  MSK:   Moves extremities without difficulty  Other:  Left great toe is grossly swollen with bruising, redness, warmth.  There is a 3 cm area of white to the medial aspect of the toe.  There is an open wound in the center.  The nail is intact.  DP pulse 2+.  Medical Decision Making  Medically screening exam initiated at 4:10 PM.  Appropriate orders placed.  Mata Rowen was informed that the remainder of the evaluation will be completed by another provider, this initial triage assessment does not replace that evaluation, and the importance of remaining in the ED until their evaluation is complete.     Stana Bunting, PA-C 02/27/21 1611    03/01/21, MD 02/27/21 1950

## 2021-02-27 NOTE — H&P (Addendum)
History and Physical    Alison Gates YNW:[id number] DOB: [birth year] DOA: 02/27/2021  PCP: Pcp, No Patient coming from: Home  Chief Complaint: Toe pain and swelling  HPI: Alison Gates is a 45 y.o. Spanish-speaking female with medical history significant of poorly controlled type 2 diabetes, peripheral arterial disease.  She was admitted in October 2021 for right fifth toe osteomyelitis and underwent amputation of this toe.  Patient was not on treatment for her diabetes at that time taking she had prediabetes.  A1c was 11 but she did not want to start insulin.  She was discharged on glipizide and metformin.  She presents to the ED today for evaluation of pain and swelling of left great toe and swelling and injury of right fourth toe as well.  Tachycardic but not febrile.  Labs notable for WBC 15.2.  Blood glucose 501 without signs of DKA (bicarb 27, anion gap 9).  Lactic acid 2.1 >1.1.  Blood culture x2 drawn.  ESR and CRP pending. X-ray of left foot showing soft tissue swelling of the great toe consistent with cellulitis but no evidence of osteomyelitis.  X-ray of right foot showing subcutaneous soft tissue edema and emphysema along the distal fourth digit with crazy trace cortical erosion of the phalangeal tuft (limited evaluation with no oblique view).  MRI of right foot ordered for further evaluation and currently pending.  Patient was given NovoLog 12 units, ceftriaxone, vancomycin, and 2 L normal saline boluses.  Blood glucose improved to 236.  Spanish video interpreter used.  Patient states a week ago she noticed blisters on her left great toe and right fourth toe which popped and since then the toes have become swollen and draining.  States it is mostly the left great toe which is bothering her as it is painful and draining pus.  States the drainage from the right fourth toe is clear.  She is also having fevers.  She has been coughing for the past year after she had a cold and the cough never  improved.  She has received 2 doses of the COVID-vaccine but no booster.  She is not vaccinated against flu.  Denies chest pain, abdominal pain, nausea, or vomiting.  States her doctor prescribed her an insulin pen but she was not using it regularly and ran out 3 weeks ago.  She is also taking metformin but ran out a week ago.  She does not take any other medications.  Review of Systems:  All systems reviewed and apart from history of presenting illness, are negative.  Past Medical History:  Diagnosis Date   Diabetes ([place]) 10/2018    Past Surgical History:  Procedure Laterality Date   AMPUTATION Right 01/26/2020   Procedure: RIGHT FOOT LITTLE TOE AMPUTATION;  Surgeon: Newt Minion, MD;  Location: [place];  Service: Orthopedics;  Laterality: Right;     reports that she has never smoked. She has never used smokeless tobacco. She reports that she does not currently use alcohol. She reports that she does not currently use drugs.  No Known Allergies  History reviewed. No pertinent family history.  Prior to Admission medications   Medication Sig Start Date End Date Taking? Authorizing Provider  doxycycline (VIBRA-TABS) 100 MG tablet Take 1 tablet (100 mg total) by mouth 2 (two) times daily. 04/18/20   Persons, [place], PA  gabapentin (NEURONTIN) 100 MG capsule Take 1 capsule (100 mg total) by mouth 3 (three) times daily. 04/18/20   Persons, [place], PA  glipiZIDE ([place])  5 MG tablet TAKE 1 TABLET (5 MG TOTAL) BY MOUTH TWO TIMES DAILY. 01/27/20 01/26/21  Barb Merino, MD  ibuprofen (ADVIL) 200 MG tablet Take 400 mg by mouth every 6 (six) hours as needed for fever, headache or mild pain.    [provider]  metFORMIN (GLUCOPHAGE) 500 MG tablet TAKE 1 TABLET (500 MG TOTAL) BY MOUTH TWO TIMES DAILY WITH A MEAL. 01/27/20 01/26/21  Barb Merino, MD  vitamin B-12 (CYANOCOBALAMIN) 500 MCG tablet Take 500 mcg by mouth daily.    [provider]    Physical Exam: Vitals:    02/27/21 2000 02/27/21 2024 02/27/21 2130 02/27/21 2254  BP: 125/75 121/76 110/89 100/70  Pulse: (!) 104 (!) 106 (!) 104 (!) 108  Resp: 16 16 16 16   Temp:  99.7 F (37.6 C)    TempSrc:  Oral    SpO2: 100% 100% 100% 100%  Weight:      Height:        Physical Exam Constitutional:      Appearance: She is ill-appearing and diaphoretic.  HENT:     Head: Normocephalic and atraumatic.  Eyes:     Extraocular Movements: Extraocular movements intact.     Conjunctiva/sclera: Conjunctivae normal.  Cardiovascular:     Rate and Rhythm: Regular rhythm. Tachycardia present.     Pulses: Normal pulses.     Comments: Mildly tachycardic Pulmonary:     Effort: Pulmonary effort is normal. No respiratory distress.     Breath sounds: Normal breath sounds. No wheezing or rales.     Comments: Coughing Abdominal:     General: Bowel sounds are normal. There is no distension.     Palpations: Abdomen is soft.     Tenderness: There is no abdominal tenderness.  Musculoskeletal:        General: Swelling present.     Cervical back: Normal range of motion and neck supple.     Comments: Left foot: Great toe with ulcer, swollen, erythematous, and tender to palpation.  Erythema extending up to the dorsum of the foot. Right foot: Fourth toe with ulcer, swollen, and erythematous. Dorsalis pedis pulse intact bilaterally  Skin:    General: Skin is warm.  Neurological:     General: No focal deficit present.     Mental Status: She is alert and oriented to person, place, and time.             Labs on Admission: I have personally reviewed following labs and imaging studies  CBC: Recent Labs  Lab 02/27/21 1610  WBC 15.2*  NEUTROABS 12.8*  HGB 12.7  HCT 37.5  MCV 90.8  PLT 154   Basic Metabolic Panel: Recent Labs  Lab 02/27/21 1610  NA 130*  K 4.6  CL 94*  CO2 27  GLUCOSE 501*  BUN 10  CREATININE 0.69  CALCIUM 9.2   GFR: Estimated Creatinine Clearance: 86.3 mL/min (by C-G formula  based on SCr of 0.69 mg/dL). Liver Function Tests: No results for input(s): AST, ALT, ALKPHOS, BILITOT, PROT, ALBUMIN in the last 168 hours. No results for input(s): LIPASE, AMYLASE in the last 168 hours. No results for input(s): AMMONIA in the last 168 hours. Coagulation Profile: No results for input(s): INR, PROTIME in the last 168 hours. Cardiac Enzymes: No results for input(s): CKTOTAL, CKMB, CKMBINDEX, TROPONINI in the last 168 hours. BNP (last 3 results) No results for input(s): PROBNP in the last 8760 hours. HbA1C: No results for input(s): HGBA1C in the last 72 hours. CBG:  Recent Labs  Lab 02/27/21 2115 02/27/21 2128  GLUCAP 232* 236*   Lipid Profile: No results for input(s): CHOL, HDL, LDLCALC, TRIG, CHOLHDL, LDLDIRECT in the last 72 hours. Thyroid Function Tests: No results for input(s): TSH, T4TOTAL, FREET4, T3FREE, THYROIDAB in the last 72 hours. Anemia Panel: No results for input(s): VITAMINB12, FOLATE, FERRITIN, TIBC, IRON, RETICCTPCT in the last 72 hours. Urine analysis: No results found for: COLORURINE, APPEARANCEUR, [place], [place], GLUCOSEU, [place], BILIRUBINUR, KETONESUR, PROTEINUR, UROBILINOGEN, NITRITE, LEUKOCYTESUR  Radiological Exams on Admission: DG Foot 2 Views Right  Result Date: 02/27/2021 CLINICAL DATA:  fourth toe swelling and injury. evaluate for osteomyelitis EXAM: RIGHT FOOT - 2 VIEW COMPARISON:  X-ray right foot 01/24/2020 FINDINGS: Right fifth metatarsal amputation-Clear margins, no definite cortical erosion or destruction. Subcutaneus soft tissue edema and emphysema along the distal fourth digit with query trace cortical erosion of the phalangeal tuft-limited evaluation with no oblique view obtained. There is no evidence of fracture or dislocation. No severe degenerative changes. IMPRESSION: Subcutaneus soft tissue edema and emphysema along the distal fourth digit with query trace cortical erosion of the phalangeal tuft-limited evaluation with no  oblique view obtained. Consider further evaluation with an oblique view of the foot versus MRI for further evaluation (with intravenous contrast if GFR greater than 30). Electronically Signed   By: Iven Finn M.D.   On: 02/27/2021 18:20   DG Foot Complete Left  Result Date: 02/27/2021 CLINICAL DATA:  46 year old female with a great toe wound EXAM: LEFT FOOT - COMPLETE 3+ VIEW COMPARISON:  None. FINDINGS: No acute displaced fracture.  No erosive changes of the first digit. No subluxation/dislocation. Soft tissue swelling of the great toe.  No subcutaneous gas. No radiopaque foreign body. Medial calcinosis of the tibial arteries. IMPRESSION: Soft tissue swelling of the great toe compatible with cellulitis, with no evidence of osteomyelitis on the plain film. Tibial artery atherosclerosis Electronically Signed   By: Corrie Mckusick D.O.   On: 02/27/2021 16:33    Assessment/Plan Principal Problem:   Cellulitis Active Problems:   Poorly controlled diabetes mellitus ([place])   Sepsis ([place])   Peripheral arterial disease ([place])   Sepsis secondary to left great toe and right fourth toe cellulitis Patient with poorly controlled diabetes.  Admitted a year ago for right fifth toe osteomyelitis and underwent amputation of this toe.  She now presents with erythema, swelling, and drainage of her left great toe and right fourth toe.  Left great toe is painful.  X-ray of left foot showing soft tissue swelling of the great toe consistent with cellulitis but no evidence of osteomyelitis.  X-ray of right foot showing subcutaneous soft tissue edema and emphysema along the distal fourth digit with crazy trace cortical erosion of the phalangeal tuft (limited evaluation with no oblique view).  Meets criteria for sepsis with tachycardia, leukocytosis, and mild lactic acidosis. -Continue vancomycin and ceftriaxone.  Lactic acidosis resolved after 2 L fluid boluses.  Mildly tachycardic, continue IV fluid hydration.  Blood  culture x2 pending.  ESR and CRP pending.  MRI of right foot ordered to rule out osteomyelitis and currently pending.  Will also order MRI of left foot to confirm that she does not have osteomyelitis.  If MRI positive for osteomyelitis, consult orthopedics in the morning.  Poorly controlled type 2 diabetes Due to noncompliance.  A1c was 11 a year ago and patient did not want to start insulin.  Repeat A1c 12.5 on 10/02/2020.  It appears her PCP prescribed insulin but she was  not compliant and ran out of it 3 weeks ago.  Also ran out of metformin a week ago.  She is not on glipizide.  Blood glucose significantly elevated at 501 without signs of DKA.  She was given NovoLog 12 units and blood glucose now improved to 236. -Hold metformin.  Repeat A1c.  Start Semglee 10 units daily.  Sliding scale insulin moderate with meals.  Consult diabetes coordinator.  Peripheral arterial disease Plain film x-ray of left foot showing tibial artery atherosclerosis.  ABIs done a year ago showing mild right lower extremity arterial disease and no significant left lower extremity arterial disease. -Repeat ABIs.  Risk factor modification: Repeat A1c, check lipid panel.  Addendum: Cough Per patient, ongoing for the past 1 year.  She is vaccinated against COVID but has not received a booster.  Not vaccinated against flu.  Not hypoxic. -Chest x-ray ordered.  COVID and influenza PCR ordered, Airborne and contact precautions  DVT prophylaxis: SCDs Code Status: Full code Family Communication: No family available at this time. Disposition Plan: Status is: Inpatient  Remains inpatient appropriate because: Needs IV antibiotics for cellulitis and if MRI positive for osteo, may need amputation of toe(s)  Level of care:  Level of care: Telemetry Medical  The medical decision making on this patient was of high complexity and the patient is at high risk for clinical deterioration, therefore this is a level 3 visit.  Shela Leff MD [facility]  If 7PM-7AM, please contact night-coverage [email or web address]  02/28/2021, 12:11 AM

## 2021-02-27 NOTE — ED Notes (Signed)
MRI called and clarified questions with interpretor

## 2021-02-27 NOTE — ED Notes (Signed)
CBG 501 primary RN aware

## 2021-02-27 NOTE — ED Notes (Signed)
Pt ambulated to restroom at this time without assistance 

## 2021-02-27 NOTE — ED Triage Notes (Signed)
C/o left great toe swelling + pain; started as an itch 1 week ago; denies injury. + hx of diabetes.

## 2021-02-28 ENCOUNTER — Inpatient Hospital Stay (HOSPITAL_COMMUNITY): Payer: 59

## 2021-02-28 DIAGNOSIS — L089 Local infection of the skin and subcutaneous tissue, unspecified: Secondary | ICD-10-CM

## 2021-02-28 DIAGNOSIS — E11628 Type 2 diabetes mellitus with other skin complications: Secondary | ICD-10-CM

## 2021-02-28 DIAGNOSIS — L0390 Cellulitis, unspecified: Secondary | ICD-10-CM

## 2021-02-28 DIAGNOSIS — A419 Sepsis, unspecified organism: Secondary | ICD-10-CM

## 2021-02-28 DIAGNOSIS — I739 Peripheral vascular disease, unspecified: Secondary | ICD-10-CM

## 2021-02-28 DIAGNOSIS — E1165 Type 2 diabetes mellitus with hyperglycemia: Secondary | ICD-10-CM

## 2021-02-28 LAB — RESP PANEL BY RT-PCR (FLU A&B, COVID) ARPGX2
Influenza A by PCR: NEGATIVE
Influenza B by PCR: NEGATIVE
SARS Coronavirus 2 by RT PCR: NEGATIVE

## 2021-02-28 LAB — GLUCOSE, CAPILLARY
Glucose-Capillary: 240 mg/dL — ABNORMAL HIGH (ref 70–99)
Glucose-Capillary: 268 mg/dL — ABNORMAL HIGH (ref 70–99)

## 2021-02-28 LAB — LIPID PANEL
Cholesterol: 127 mg/dL (ref 0–200)
HDL: 38 mg/dL — ABNORMAL LOW (ref >40)
LDL Cholesterol: 71 mg/dL (ref 0–99)
Total CHOL/HDL Ratio: 3.3 RATIO
Triglycerides: 91 mg/dL (ref <150)
VLDL: 18 mg/dL (ref 0–40)

## 2021-02-28 LAB — CBC
HCT: 34.7 % — ABNORMAL LOW (ref 36.0–46.0)
Hemoglobin: 11.7 g/dL — ABNORMAL LOW (ref 12.0–15.0)
MCH: 30.7 pg (ref 26.0–34.0)
MCHC: 33.7 g/dL (ref 30.0–36.0)
MCV: 91.1 fL (ref 80.0–100.0)
Platelets: 294 10*3/uL (ref 150–400)
RBC: 3.81 MIL/uL — ABNORMAL LOW (ref 3.87–5.11)
RDW: 11.6 % (ref 11.5–15.5)
WBC: 12.5 10*3/uL — ABNORMAL HIGH (ref 4.0–10.5)
nRBC: 0 % (ref 0.0–0.2)

## 2021-02-28 LAB — C-REACTIVE PROTEIN: CRP: 22.6 mg/dL — ABNORMAL HIGH (ref <1.0)

## 2021-02-28 LAB — HEMOGLOBIN A1C
Hgb A1c MFr Bld: 10.5 % — ABNORMAL HIGH (ref 4.8–5.6)
Mean Plasma Glucose: 254.65 mg/dL

## 2021-02-28 LAB — SEDIMENTATION RATE: Sed Rate: 123 mm/hr — ABNORMAL HIGH (ref 0–22)

## 2021-02-28 LAB — HIV ANTIBODY (ROUTINE TESTING W REFLEX): HIV Screen 4th Generation wRfx: NONREACTIVE

## 2021-02-28 IMAGING — MR MR FOOT*L* WO/W CM
9 series · 40 of 40 positions shown · IV contrast (gadavist)
Comparison: Radiographs [DATE]

CLINICAL DATA: Pain and swelling of the great toe.

EXAM:
MRI OF THE LEFT FOREFOOT WITHOUT AND WITH CONTRAST
TECHNIQUE: Multiplanar, multisequence MR imaging of the left foot was performed
both before and after administration of intravenous contrast.
CONTRAST:  7mL GADAVIST GADOBUTROL 1 MMOL/ML IV SOLN

[Series 4: T1 · coronal · left · 3.0mm · 0.47mm/px · 4 of 48 slices shown (1 of 2)]
[im 1/48]
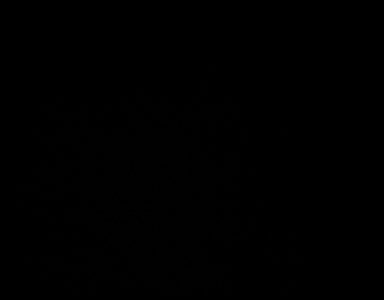
[im 16/48]
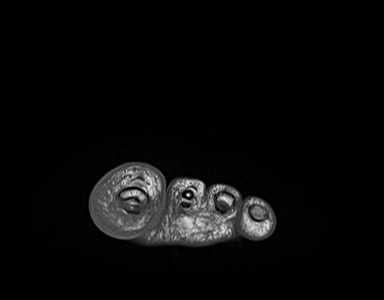
[im 32/48]
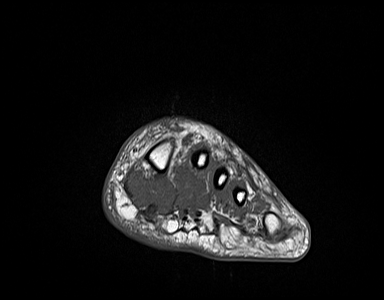
[im 48/48]
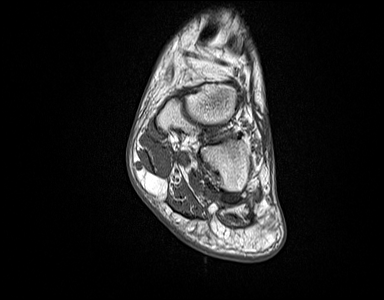

[Series 5: T2 fat-sat · coronal · left · 3.0mm · 0.49mm/px · 4 of 48 slices shown (1 of 2)]
[im 1/48]
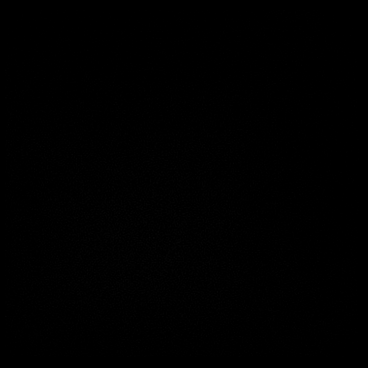
[im 16/48]
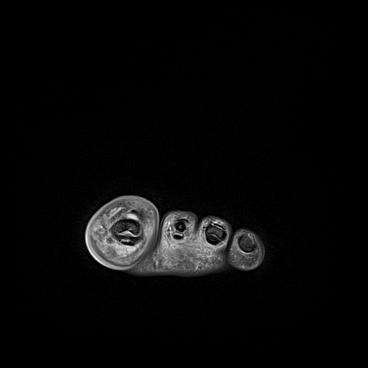
[im 32/48]
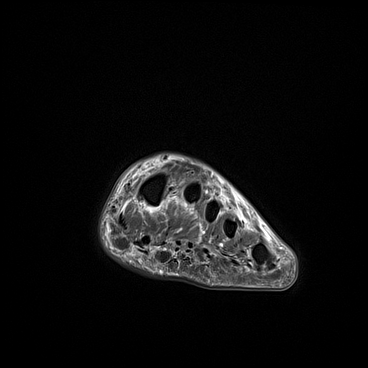
[im 48/48]
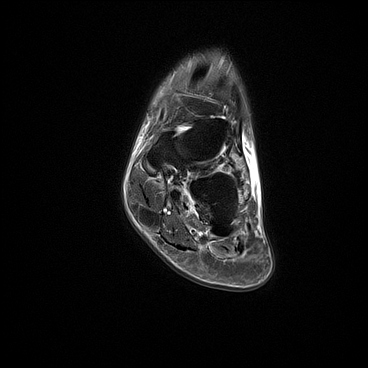

[Series 6: T1 · axial · left · 3.0mm · 0.49mm/px · z∈[-130,+57]mm · 4 of 48 slices shown (2 of 2)]
[im 1/48]
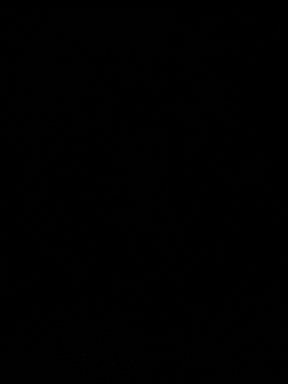
[im 16/48]
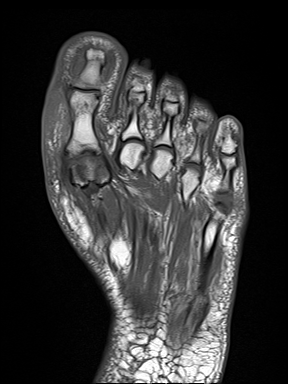
[im 32/48]
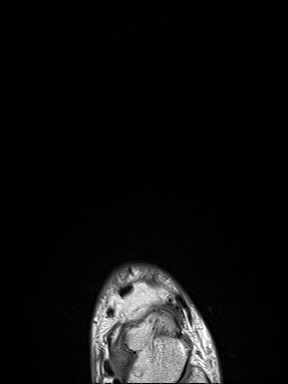
[im 48/48]
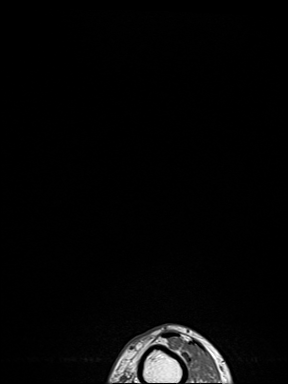

[Series 7: T2 fat-sat · axial · left · 3.0mm · 0.57mm/px · z∈[-130,+57]mm · 5 of 48 slices shown (2 of 2)]
[im 1/48]
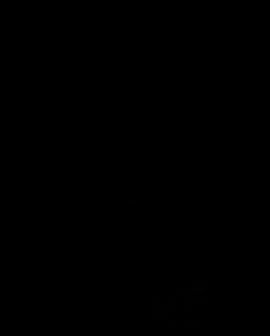
[im 12/48]
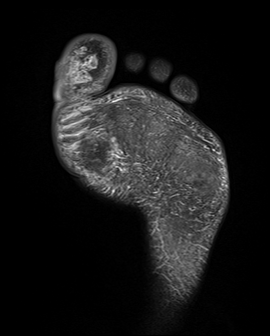
[im 24/48]
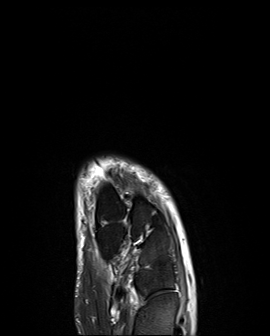
[im 36/48]
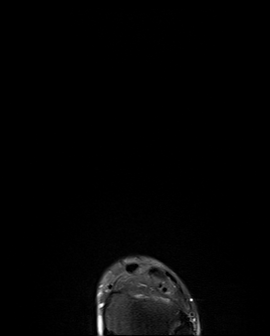
[im 48/48]
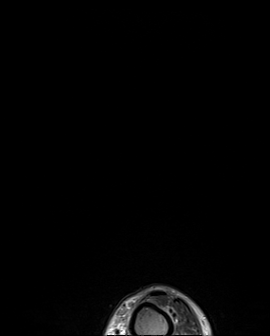

[Series 8: STIR · sagittal · left · 3.0mm · 0.70mm/px · 4 of 36 slices shown]
[im 1/36]
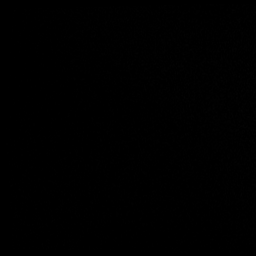
[im 12/36]
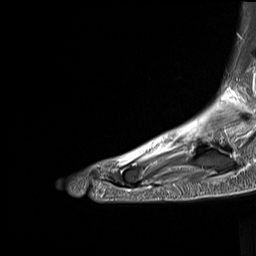
[im 24/36]
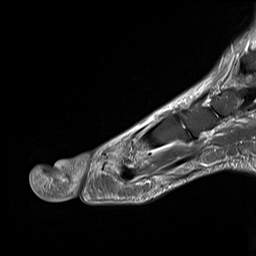
[im 36/36]
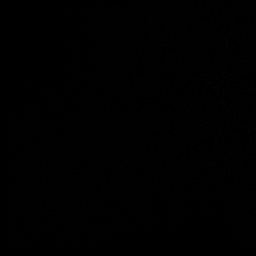

[Series 9: T1 fat-sat · coronal · non-contrast · left · 3.0mm · 0.59mm/px · 5 of 48 slices shown]
[im 1/48]
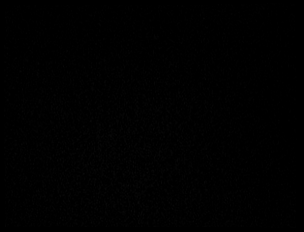
[im 12/48]
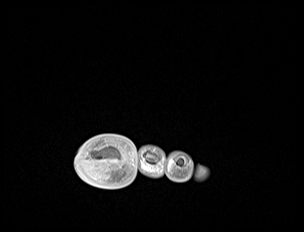
[im 24/48]
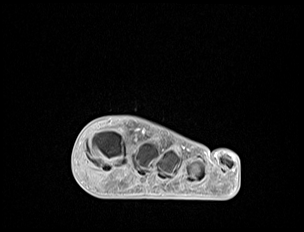
[im 36/48]
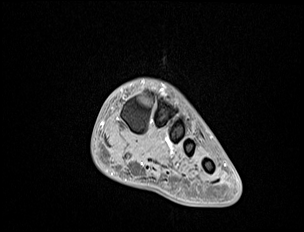
[im 48/48]
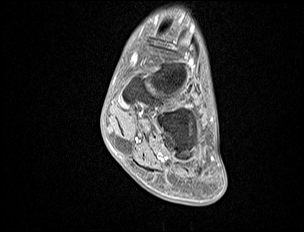

[Series 14: T1 fat-sat post-contrast · coronal · left · 3.0mm · 0.59mm/px · 5 of 48 slices shown (1 of 3)]
[im 1/48]
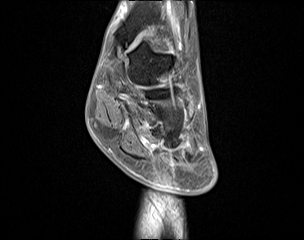
[im 12/48]
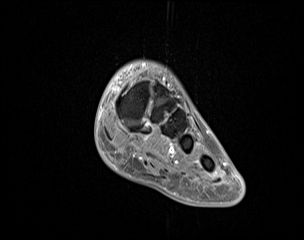
[im 24/48]
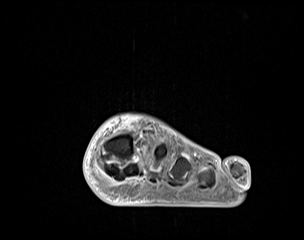
[im 36/48]
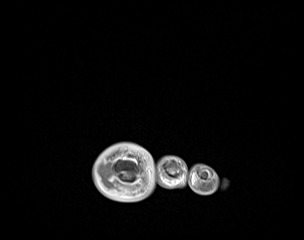
[im 48/48]
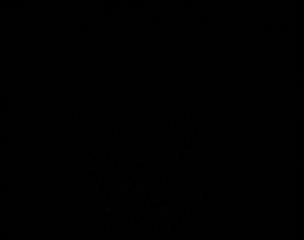

[Series 15: T1 fat-sat post-contrast · sagittal · left · 3.0mm · 0.59mm/px · 4 of 39 slices shown (2 of 3)]
[im 1/39]
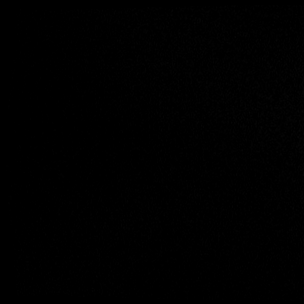
[im 13/39]
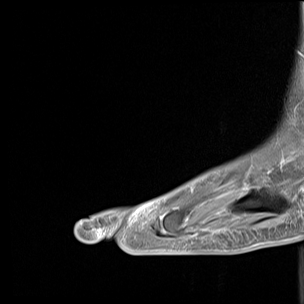
[im 26/39]
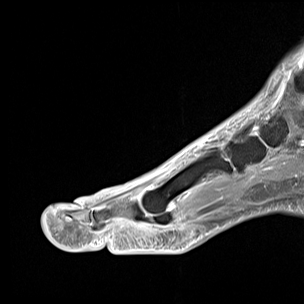
[im 39/39]
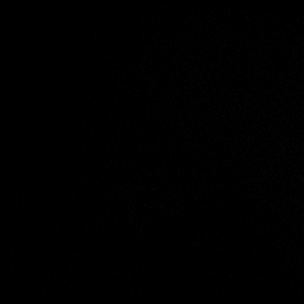

[Series 16: T1 fat-sat post-contrast · axial · left · 3.0mm · 0.59mm/px · z∈[-119,+68]mm · 5 of 48 slices shown (3 of 3)]
[im 1/48]
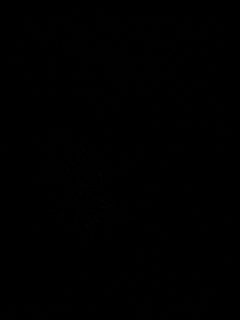
[im 12/48]
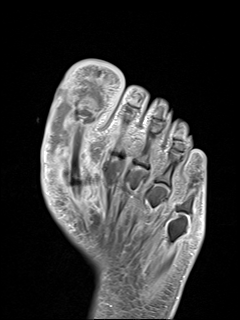
[im 24/48]
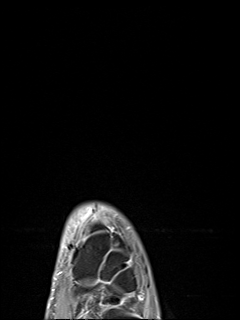
[im 36/48]
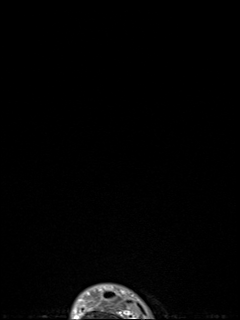
[im 48/48]
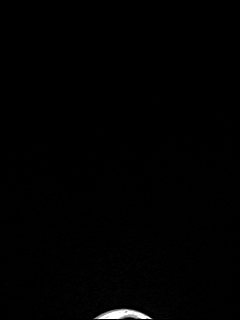

[40 of 40 positions shown; findings below may reference images not displayed]

FINDINGS: There is marked soft tissue swelling/edema/fluid surrounding the
great toe. I do not see an obvious open wound or gas in the soft
tissues. Curvilinear fluid collection along the medial and plantar
aspect of the great toe consistent with an abscess measuring a
maximum of 2.4 cm.

Abnormal T1 and T2 signal intensity and subsequent enhancement in
the distal phalanx consistent with osteomyelitis. There is a small
joint effusion at the interphalangeal joint but I do not see any
definite findings for septic arthritis.

The other bony structures are intact. No other sites of
osteomyelitis are identified. No findings for myofasciitis or
pyomyositis.
IMPRESSION: 1. 2.4 cm abscess along the medial and plantar aspect of the great
toe with significant surrounding cellulitis.
2. Osteomyelitis involving the distal phalanx of the great toe.
3. Small joint effusion at the interphalangeal joint of the great
toe but I do not see any definite findings for septic arthritis.
4. No findings for myofasciitis or pyomyositis.

## 2021-02-28 IMAGING — DX DG CHEST 1V PORT
1 series · 1 of 1 positions shown · non-contrast
Comparison: None.

CLINICAL DATA: Cough.

EXAM:
PORTABLE CHEST 1 VIEW

[chest]
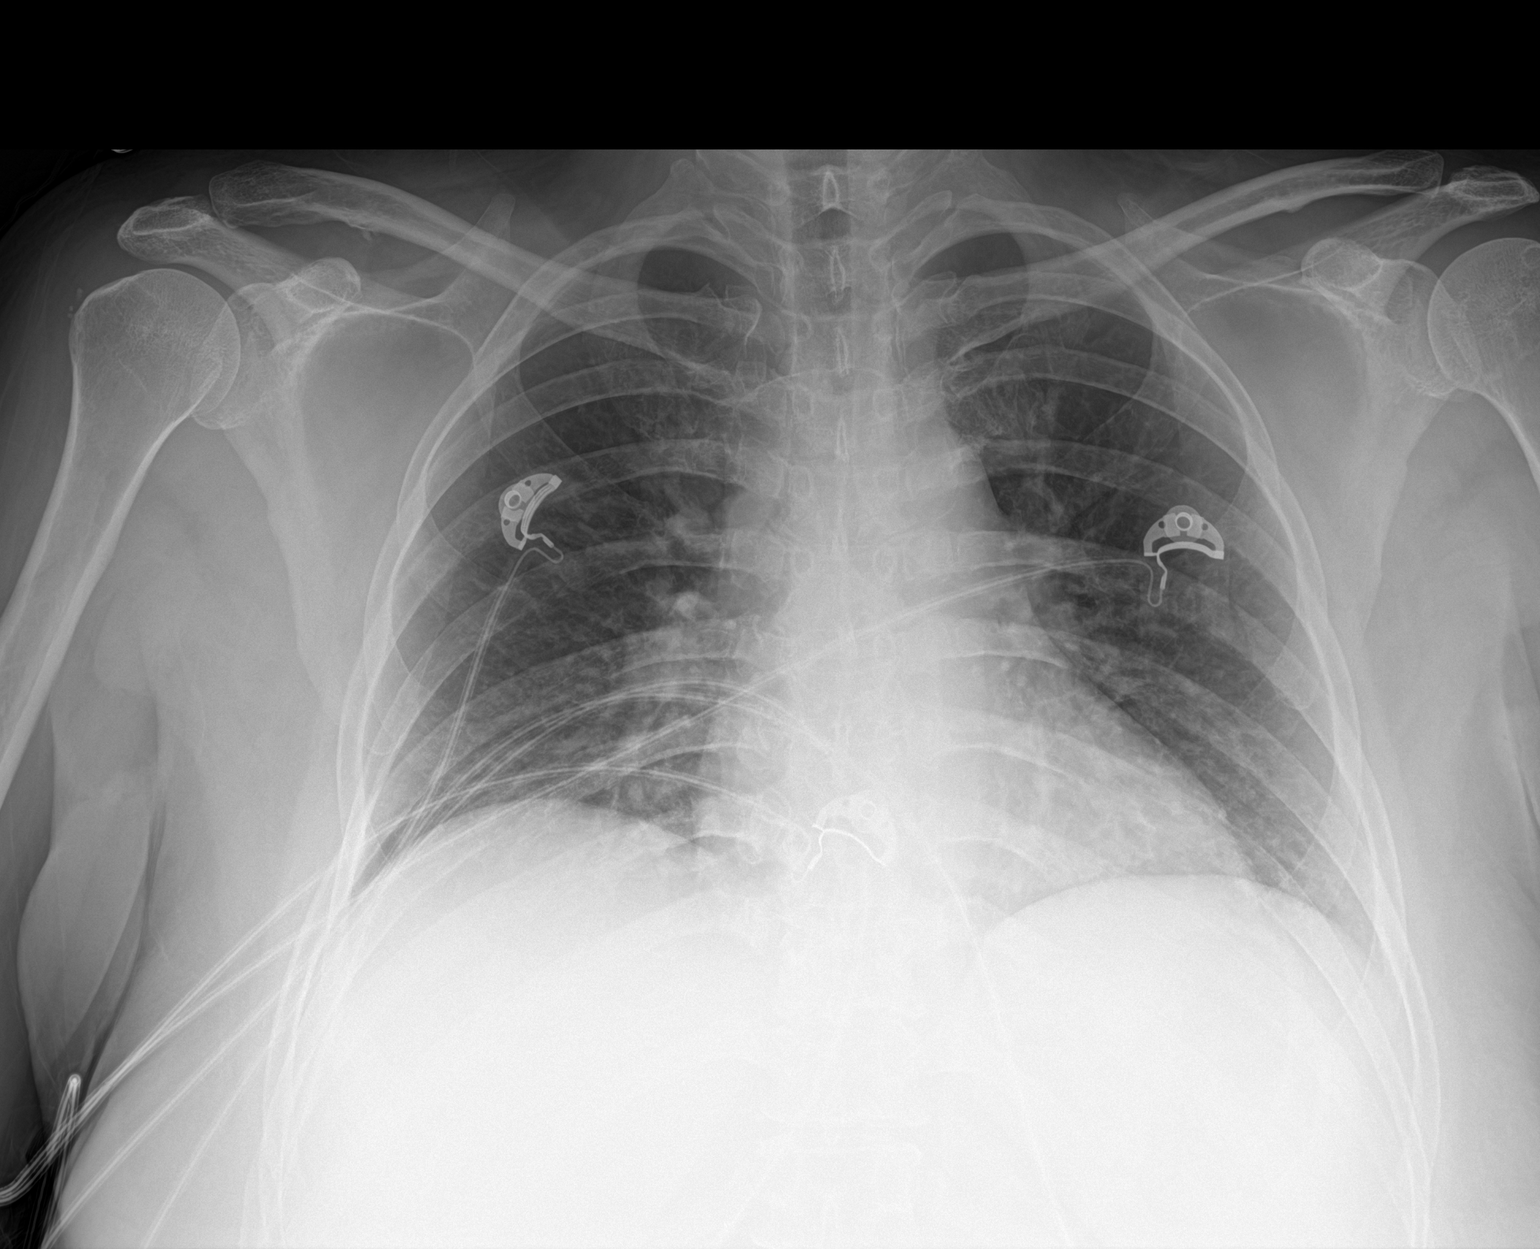

[1 of 1 positions shown; findings below may reference images not displayed]

FINDINGS: The heart size and mediastinal contours are within normal limits.
Both lungs are clear. The visualized skeletal structures are
unremarkable.
IMPRESSION: No active disease.

## 2021-02-28 IMAGING — MR MR FOOT*R* WO/W CM
9 series · 40 of 40 positions shown · IV contrast (gadavist)
Comparison: MRI [DATE]

CLINICAL DATA: Pain and swelling of the right fourth toe. History
of prior amputation of the fifth toe.

EXAM:
MRI OF THE RIGHT FOREFOOT WITHOUT AND WITH CONTRAST
TECHNIQUE: Multiplanar, multisequence MR imaging of the right foot was
performed before and after the administration of intravenous
contrast.
CONTRAST:  7mL GADAVIST GADOBUTROL 1 MMOL/ML IV SOLN

[Series 3: T1 · coronal · left · 3.0mm · 0.47mm/px · 4 of 48 slices shown (1 of 2)]
[im 1/48]
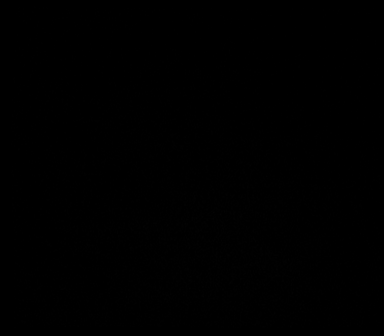
[im 16/48]
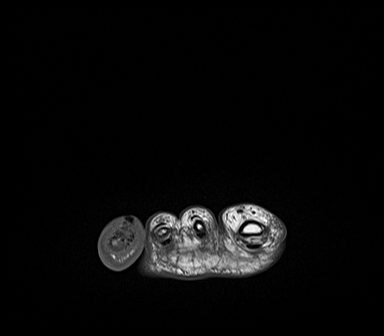
[im 32/48]
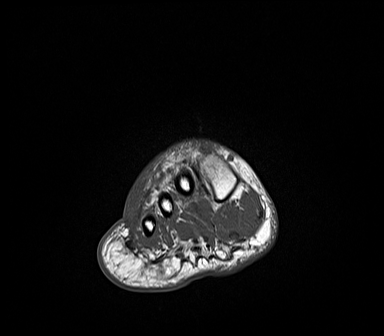
[im 48/48]
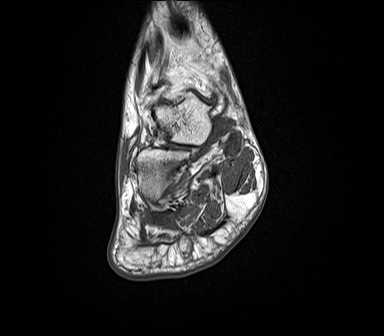

[Series 4: T2 fat-sat · coronal · left · 3.0mm · 0.49mm/px · 4 of 48 slices shown (1 of 2)]
[im 1/48]
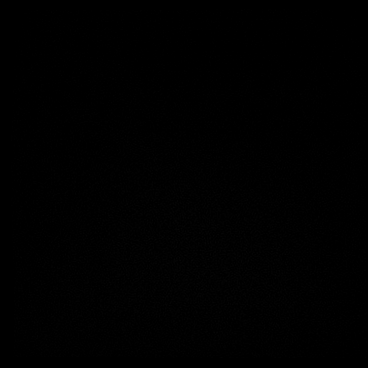
[im 16/48]
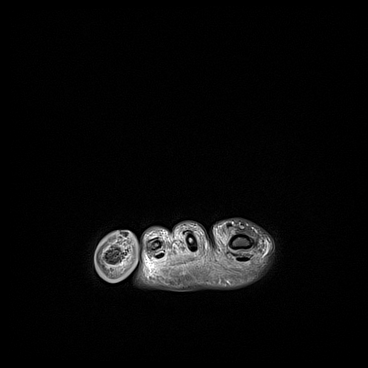
[im 32/48]
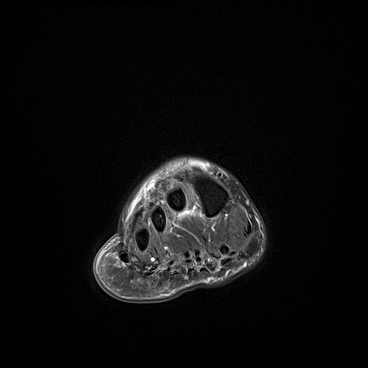
[im 48/48]
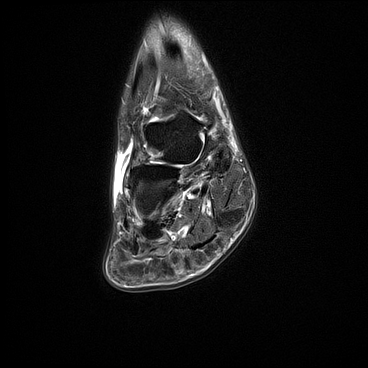

[Series 5: T2 fat-sat · axial · left · 3.0mm · 0.57mm/px · z∈[-119,+68]mm · 4 of 48 slices shown (2 of 2)]
[im 1/48]
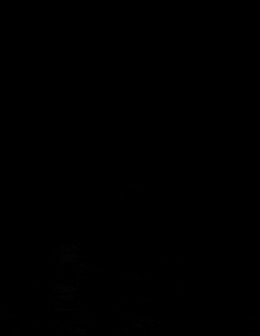
[im 16/48]
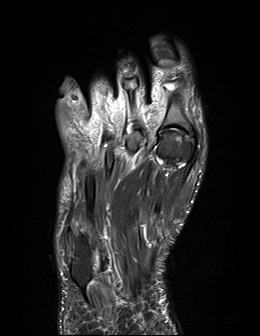
[im 32/48]
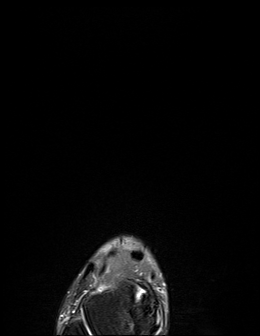
[im 48/48]
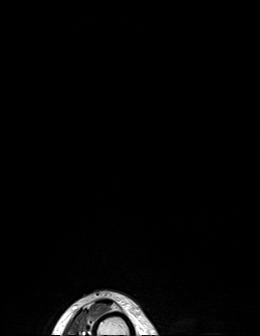

[Series 6: T1 · axial · left · 3.0mm · 0.49mm/px · z∈[-119,+68]mm · 5 of 48 slices shown (2 of 2)]
[im 1/48]
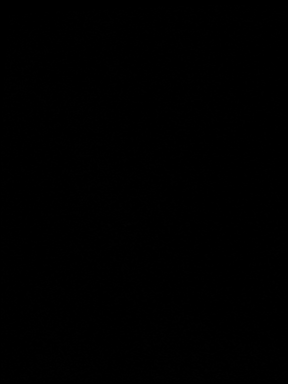
[im 12/48]
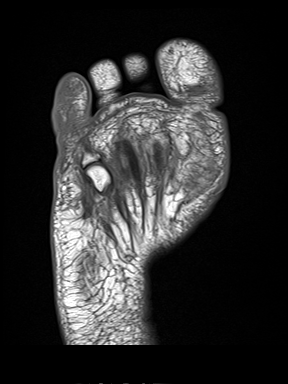
[im 24/48]
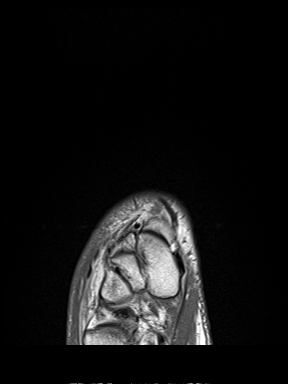
[im 36/48]
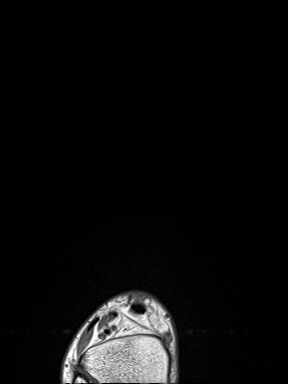
[im 48/48]
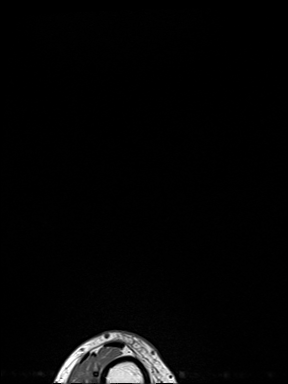

[Series 7: STIR · sagittal · left · 3.0mm · 0.70mm/px · 4 of 38 slices shown]
[im 1/38]
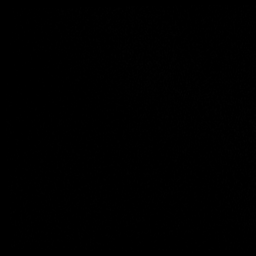
[im 13/38]
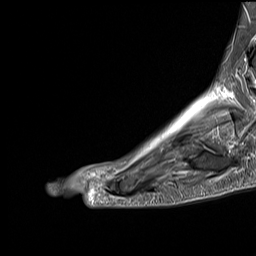
[im 25/38]
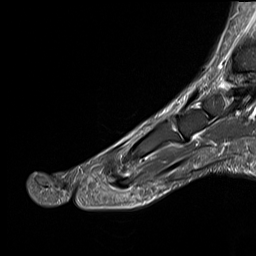
[im 38/38]
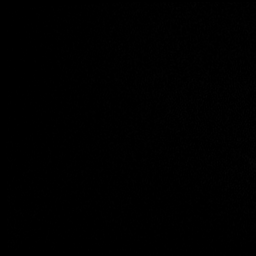

[Series 8: T1 fat-sat · coronal · non-contrast · left · 3.0mm · 0.56mm/px · 5 of 48 slices shown]
[im 1/48]
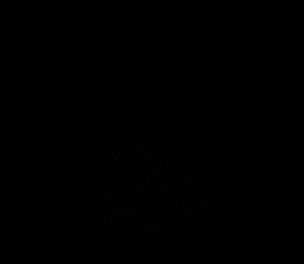
[im 12/48]
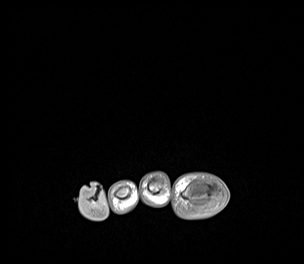
[im 24/48]
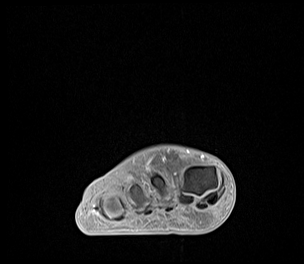
[im 36/48]
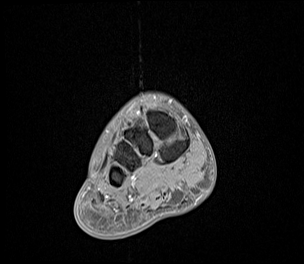
[im 48/48]
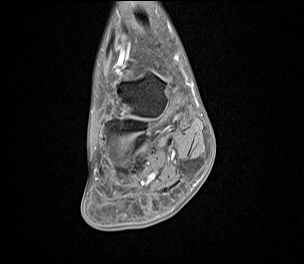

[Series 9: T1 fat-sat post-contrast · coronal · left · 3.0mm · 0.56mm/px · 5 of 48 slices shown (1 of 3)]
[im 1/48]
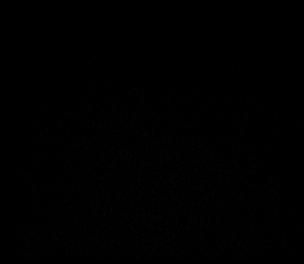
[im 12/48]
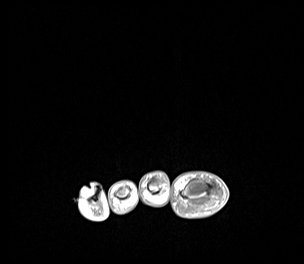
[im 24/48]
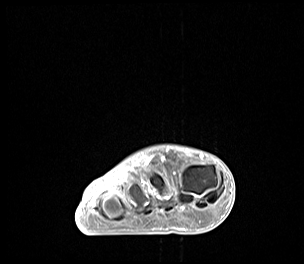
[im 36/48]
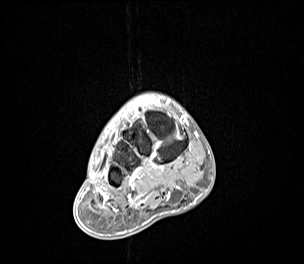
[im 48/48]
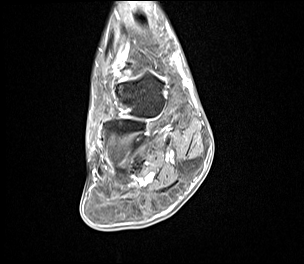

[Series 10: T1 fat-sat post-contrast · sagittal · left · 3.0mm · 0.59mm/px · 4 of 38 slices shown (2 of 3)]
[im 1/38]
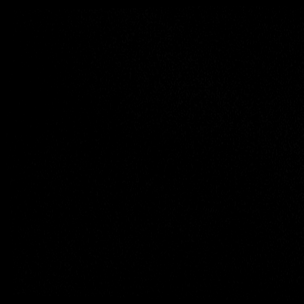
[im 13/38]
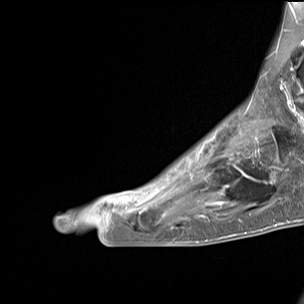
[im 25/38]
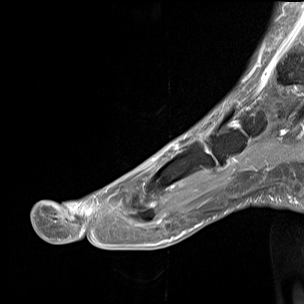
[im 38/38]
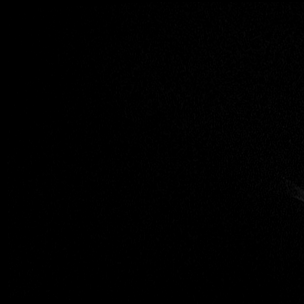

[Series 11: T1 fat-sat post-contrast · axial · left · 3.0mm · 0.59mm/px · z∈[-115,+68]mm · 5 of 47 slices shown (3 of 3)]
[im 1/47]
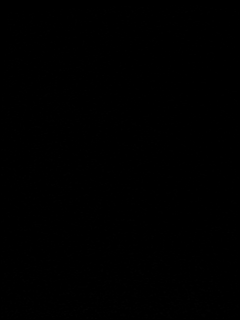
[im 12/47]
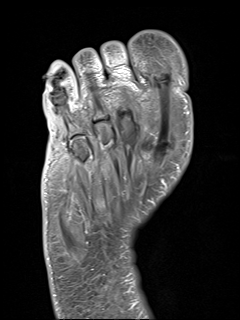
[im 24/47]
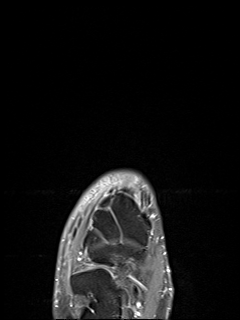
[im 35/47]
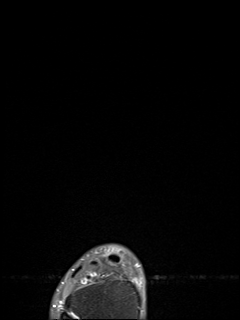
[im 47/47]
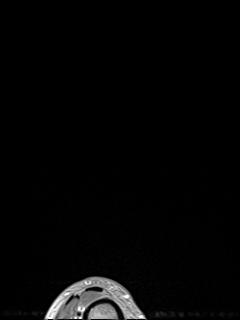

[40 of 40 positions shown; findings below may reference images not displayed]

FINDINGS: Severe subcutaneous soft tissue swelling/edema and fluid involving
the fourth toe. There is an open wound along the dorsum of the toe
and gas in the soft tissues. Abnormal T1 and T2 signal intensity and
advanced erosive changes involving the middle and distal phalanges
consistent with osteomyelitis. Probable septic arthritis at the
distal interphalangeal joint. After contrast administration there
are areas of nonenhancing soft tissue which could reflect necrotic
tissue.

Prior amputation of the fifth toe and most of the fifth metatarsal.

Moderate forefoot and midfoot degenerative changes but I do not see
any other sites worrisome for septic arthritis or osteomyelitis.
Mild diffuse subcutaneous soft tissue swelling/edema mainly along
the dorsum of the foot consistent with cellulitis. No findings for
myofasciitis or pyomyositis.
IMPRESSION: 1. Osteomyelitis involving the middle and distal phalanges of the
fourth toe as detailed above.
2. Cellulitis without discrete drainable soft tissue abscess.
3. No findings for myofasciitis or pyomyositis.

## 2021-02-28 MED ORDER — INSULIN ASPART 100 UNIT/ML IJ SOLN
3.0000 [IU] | Freq: Three times a day (TID) | INTRAMUSCULAR | Status: DC
  Administered 2021-02-28: 3 [IU] via SUBCUTANEOUS

## 2021-02-28 MED ORDER — INSULIN GLARGINE-YFGN 100 UNIT/ML ~~LOC~~ SOLN
10.0000 [IU] | Freq: Every day | SUBCUTANEOUS | Status: DC
Start: 2021-02-28 — End: 2021-02-28
  Administered 2021-02-28: 10 [IU] via SUBCUTANEOUS
  Filled 2021-02-28 (×3): qty 0.1

## 2021-02-28 MED ORDER — INSULIN ASPART 100 UNIT/ML IJ SOLN
0.0000 [IU] | Freq: Three times a day (TID) | INTRAMUSCULAR | Status: DC
  Administered 2021-02-28: 17:00:00 8 [IU] via SUBCUTANEOUS
  Administered 2021-02-28: 10 [IU] via SUBCUTANEOUS
  Administered 2021-02-28: 5 [IU] via SUBCUTANEOUS
  Administered 2021-03-01: 8 [IU] via SUBCUTANEOUS
  Administered 2021-03-01: 12:00:00 11 [IU] via SUBCUTANEOUS
  Administered 2021-03-01: 09:00:00 8 [IU] via SUBCUTANEOUS
  Administered 2021-03-02: 11 [IU] via SUBCUTANEOUS
  Administered 2021-03-02: 3 [IU] via SUBCUTANEOUS
  Administered 2021-03-03: 8 [IU] via SUBCUTANEOUS
  Administered 2021-03-03: 2 [IU] via SUBCUTANEOUS
  Administered 2021-03-03 – 2021-03-04 (×2): 8 [IU] via SUBCUTANEOUS
  Administered 2021-03-04: 09:00:00 5 [IU] via SUBCUTANEOUS
  Filled 2021-02-28: qty 1

## 2021-02-28 MED ORDER — ACETAMINOPHEN 650 MG RE SUPP: 650.0000 mg | Freq: Four times a day (QID) | RECTAL | Status: DC | PRN

## 2021-02-28 MED ORDER — SODIUM CHLORIDE 0.9 % IV SOLN: INTRAVENOUS | Status: AC

## 2021-02-28 MED ORDER — JUVEN PO PACK
1.0000 | PACK | Freq: Two times a day (BID) | ORAL | Status: DC
  Administered 2021-03-01 (×2): 1 via ORAL
  Filled 2021-02-28 (×2): qty 1

## 2021-02-28 MED ORDER — VANCOMYCIN HCL 1250 MG/250ML IV SOLN
1250.0000 mg | Freq: Two times a day (BID) | INTRAVENOUS | Status: DC
  Administered 2021-02-28 – 2021-03-02 (×5): 1250 mg via INTRAVENOUS
  Filled 2021-02-28 (×7): qty 250

## 2021-02-28 MED ORDER — INSULIN STARTER KIT- PEN NEEDLES (SPANISH)
1.0000 | Freq: Once | Status: DC
  Filled 2021-02-28: qty 1

## 2021-02-28 MED ORDER — SODIUM CHLORIDE 0.9 % IV SOLN
1.0000 g | INTRAVENOUS | Status: DC
  Administered 2021-02-28: 1 g via INTRAVENOUS
  Filled 2021-02-28: qty 10

## 2021-02-28 MED ORDER — ACETAMINOPHEN 325 MG PO TABS: 650.0000 mg | ORAL_TABLET | Freq: Four times a day (QID) | ORAL | Status: DC | PRN

## 2021-02-28 MED ORDER — INSULIN GLARGINE-YFGN 100 UNIT/ML ~~LOC~~ SOLN
20.0000 [IU] | Freq: Every day | SUBCUTANEOUS | Status: DC
  Administered 2021-03-01: 20 [IU] via SUBCUTANEOUS
  Filled 2021-02-28 (×4): qty 0.2

## 2021-02-28 MED ORDER — ENOXAPARIN SODIUM 40 MG/0.4ML IJ SOSY
40.0000 mg | PREFILLED_SYRINGE | INTRAMUSCULAR | Status: DC
  Administered 2021-02-28 – 2021-03-03 (×4): 40 mg via SUBCUTANEOUS
  Filled 2021-02-28 (×4): qty 0.4

## 2021-02-28 MED ORDER — GADOBUTROL 1 MMOL/ML IV SOLN
7.0000 mL | Freq: Once | INTRAVENOUS | Status: AC | PRN
  Administered 2021-02-28: 7 mL via INTRAVENOUS

## 2021-02-28 MED ORDER — LIVING WELL WITH DIABETES BOOK - IN SPANISH
Freq: Once | Status: DC
  Filled 2021-02-28: qty 1

## 2021-02-28 NOTE — Progress Notes (Addendum)
Inpatient Diabetes Program Recommendations  AACE/ADA: New Consensus Statement on Inpatient Glycemic Control (2015)  Target Ranges:  Prepandial:   less than 140 mg/dL      Peak postprandial:   less than 180 mg/dL (1-2 hours)      Critically ill patients:  140 - 180 mg/dL   Lab Results  Component Value Date   GLUCAP 236 (H) 02/27/2021   HGBA1C 10.5 (H) 02/27/2021    Review of Glycemic Control  Diabetes history: DM 2 Outpatient Diabetes medications: Glipizide 5 mg bid, metformin 500 mg bid Current orders for Inpatient glycemic control:  Semglee 10 units Novolog 0-15 units tid  Glucose 501 on presentation Pt reports insurance needs verification insulin pen 70/30 insulin back up from [facility] No PCP listed, pt goes to clinic at work -  Need [facility] consult  Note: pt diagnosed with DM 01/2020 didn't want insulin at the time with an A1c of 11%. Placed on glipizide and metformin. Seen by Diabetes Coordinator at that time. These meds have not been titrated. Needs insulin at this time will see if pt refuses again.  Watch trends on current regimen. Semglee just started this am.   To see this afternoon.   Addendum 1:17 pm:  Spoke with pt at bedside via interpreter [name] [phone], [name] [phone]) regarding glucose control at home. Pt reports being only on metformin 500 mg bid. She reports shew only took Glipizide for a short time after being in the hospital a year ago. She followed up at a clinic through work and was able to continue the metformin, She reports getting insulin pens through work but only 3 for a short time. Pt reports having insurance through work but has the information in her purse, which is not with her. Discussed the need for 70/30 insulin when to use it. Showed pt how to use the insulin pen again. Pt remembered the steps.  Reviewed lifestyle modifications. Pt drinks fruit juice, no soda. Pt reports not liking rice or eats many tortillas but eats a lot of beans and  lentils/eggs/avacados. Discussed portion sizes.   Pt reports having a glucometer and supplies informed her of the back up insulin from [facility] Pt is agreeable to use insulin at this time.  D/c: -  70/30 insulin pen (pt reports having insurance dose not know name but has card in purse which is not with her at this time) -  Insulin pen needles order [phone]  -  Pt has glucometer Back up insulin Novolin ReliOn Flex pen 70/30 order # [id number]   Thanks,  Christena Deem RN, MSN, BC-ADM Inpatient Diabetes Coordinator Team Pager [phone] (8a-5p)

## 2021-02-28 NOTE — Discharge Instructions (Signed)
Glucose Products:  ReliOnT glucose products raise low blood sugar fast. Tablets are free of fat, caffeine, sodium and gluten. They are portable and easy to carry, making it easier for people with diabetes to BE PREPARED for lows.  Human Insulin  Novolin/ReliOnT (recombinant DNA origin) is manufactured for [employer] by [employer] need 70/30 insulin Novolin/ReliOnT Insulin Pens*  .....  $42.88 You need 70/30 insulin  Insulin Delivery ReliOnT syringes and pen needles provide precision technology, comfort and accuracy in insulin delivery at affordable prices. ReliOnT Pen Needles*  50 ct....................................................$9.00 Available in 40mm, 70mm, 1mm & 24mm ReliOnT Insulin Syringes*  100 ct ............ $12.58 Available in 29G, 30G & 31G (3/10cc, 1/2cc & 1cc units)

## 2021-02-28 NOTE — ED Notes (Signed)
Verbal report given to Constance Goltz RN at this time

## 2021-02-28 NOTE — Progress Notes (Signed)
Pharmacy Antibiotic Note  Alison Gates is a 45 y.o. female admitted on 02/27/2021 with LLE cellulitis.  Pharmacy has been consulted for Vancomycin  dosing.  Vancomycin 1500 mg IV given in ED at 2300  Plan: Vancomycin 1250 mg IV q12h  Height: 5\' 2"  (157.5 cm) Weight: 77.1 kg (170 lb) IBW/kg (Calculated) : 50.1  Temp (24hrs), Avg:98.8 F (37.1 C), Min:97.9 F (36.6 C), Max:99.7 F (37.6 C)  Recent Labs  Lab 02/27/21 1610 02/27/21 1703 02/27/21 2027  WBC 15.2*  --   --   CREATININE 0.69  --   --   LATICACIDVEN  --  2.1* 1.1    Estimated Creatinine Clearance: 86.3 mL/min (by C-G formula based on SCr of 0.69 mg/dL).    No Known Allergies  2028 02/28/2021 12:23 AM

## 2021-02-28 NOTE — Plan of Care (Signed)
  Problem: Education: Goal: Knowledge of General Education information will improve Description: Including pain rating scale, medication(s)/side effects and non-pharmacologic comfort measures Outcome: Progressing   Problem: Health Behavior/Discharge Planning: Goal: Ability to manage health-related needs will improve Outcome: Progressing   Problem: Clinical Measurements: Goal: Ability to maintain clinical measurements within normal limits will improve Outcome: Progressing Goal: Will remain free from infection Outcome: Progressing Goal: Diagnostic test results will improve Outcome: Progressing Goal: Respiratory complications will improve Outcome: Progressing   Problem: Activity: Goal: Risk for activity intolerance will decrease Outcome: Progressing   Problem: Nutrition: Goal: Adequate nutrition will be maintained Outcome: Progressing   Problem: Pain Managment: Goal: General experience of comfort will improve Outcome: Progressing   Problem: Safety: Goal: Ability to remain free from injury will improve Outcome: Progressing

## 2021-02-28 NOTE — ED Notes (Signed)
Pt back from MRI and ambulatory to bathroom and back to stretcher without assistance or difficulty. No acute changes noted. Will continue to monitor.

## 2021-02-28 NOTE — Consult Note (Signed)
  ORTHOPAEDIC CONSULTATION  REQUESTING PHYSICIAN: Joseph, Preetha, MD  Chief Complaint: Abscess ulceration left great toe and right fourth toe.  HPI: Alison Gates is a 45 y.o. female who presents with uncontrolled type 2 diabetes about a year status post right fifth ray amputation who presents with abscess cellulitis ulceration to the left great toe and right fourth toe.  Past Medical History:  Diagnosis Date   Diabetes (HCC) 10/2018   Past Surgical History:  Procedure Laterality Date   AMPUTATION Right 01/26/2020   Procedure: RIGHT FOOT LITTLE TOE AMPUTATION;  Surgeon: Kasiya Burck V, MD;  Location: [facility] OR;  Service: Orthopedics;  Laterality: Right;   Social History   Socioeconomic History   Marital status: Single    Spouse name: Not on file   Number of children: Not on file   Years of education: Not on file   Highest education level: Not on file  Occupational History   Not on file  Tobacco Use   Smoking status: Never   Smokeless tobacco: Never  Substance and Sexual Activity   Alcohol use: Not Currently   Drug use: Not Currently   Sexual activity: Not Currently  Other Topics Concern   Not on file  Social History Narrative   Not on file   Social Determinants of Health   Financial Resource Strain: Not on file  Food Insecurity: Not on file  Transportation Needs: Not on file  Physical Activity: Not on file  Stress: Not on file  Social Connections: Not on file   History reviewed. No pertinent family history. - negative except otherwise stated in the family history section No Known Allergies Prior to Admission medications   Medication Sig Start Date End Date Taking? Authorizing Provider  gabapentin (NEURONTIN) 100 MG capsule Take 1 capsule (100 mg total) by mouth 3 (three) times daily. 04/18/20   Persons, Mary Anne, PA  glipiZIDE (GLUCOTROL) 5 MG tablet TAKE 1 TABLET (5 MG TOTAL) BY MOUTH TWO TIMES DAILY. 01/27/20 01/26/21  Ghimire, Kuber, MD  ibuprofen (ADVIL)  200 MG tablet Take 400 mg by mouth every 6 (six) hours as needed for fever, headache or mild pain.    [provider]  metFORMIN (GLUCOPHAGE) 500 MG tablet TAKE 1 TABLET (500 MG TOTAL) BY MOUTH TWO TIMES DAILY WITH A MEAL. 01/27/20 01/26/21  Ghimire, Kuber, MD  vitamin B-12 (CYANOCOBALAMIN) 500 MCG tablet Take 500 mcg by mouth daily.    [provider]   MR FOOT RIGHT W WO CONTRAST  Result Date: 02/28/2021 CLINICAL DATA:  Pain and swelling of the right fourth toe. History of prior amputation of the fifth toe. EXAM: MRI OF THE RIGHT FOREFOOT WITHOUT AND WITH CONTRAST TECHNIQUE: Multiplanar, multisequence MR imaging of the right foot was performed before and after the administration of intravenous contrast. CONTRAST:  7mL GADAVIST GADOBUTROL 1 MMOL/ML IV SOLN COMPARISON:  MRI 01/25/2020 FINDINGS: Severe subcutaneous soft tissue swelling/edema and fluid involving the fourth toe. There is an open wound along the dorsum of the toe and gas in the soft tissues. Abnormal T1 and T2 signal intensity and advanced erosive changes involving the middle and distal phalanges consistent with osteomyelitis. Probable septic arthritis at the distal interphalangeal joint. After contrast administration there are areas of nonenhancing soft tissue which could reflect necrotic tissue. Prior amputation of the fifth toe and most of the fifth metatarsal. Moderate forefoot and midfoot degenerative changes but I do not see any other sites worrisome for septic arthritis or osteomyelitis. Mild diffuse subcutaneous   soft tissue swelling/edema mainly along the dorsum of the foot consistent with cellulitis. No findings for myofasciitis or pyomyositis. IMPRESSION: 1. Osteomyelitis involving the middle and distal phalanges of the fourth toe as detailed above. 2. Cellulitis without discrete drainable soft tissue abscess. 3. No findings for myofasciitis or pyomyositis. Electronically Signed   By: Marijo Sanes M.D.   On:  02/28/2021 08:07   MR FOOT LEFT W WO CONTRAST  Result Date: 02/28/2021 CLINICAL DATA:  Pain and swelling of the great toe. EXAM: MRI OF THE LEFT FOREFOOT WITHOUT AND WITH CONTRAST TECHNIQUE: Multiplanar, multisequence MR imaging of the left foot was performed both before and after administration of intravenous contrast. CONTRAST:  51mL GADAVIST GADOBUTROL 1 MMOL/ML IV SOLN COMPARISON:  Radiographs 02/27/2021 FINDINGS: There is marked soft tissue swelling/edema/fluid surrounding the great toe. I do not see an obvious open wound or gas in the soft tissues. Curvilinear fluid collection along the medial and plantar aspect of the great toe consistent with an abscess measuring a maximum of 2.4 cm. Abnormal T1 and T2 signal intensity and subsequent enhancement in the distal phalanx consistent with osteomyelitis. There is a small joint effusion at the interphalangeal joint but I do not see any definite findings for septic arthritis. The other bony structures are intact. No other sites of osteomyelitis are identified. No findings for myofasciitis or pyomyositis. IMPRESSION: 1. 2.4 cm abscess along the medial and plantar aspect of the great toe with significant surrounding cellulitis. 2. Osteomyelitis involving the distal phalanx of the great toe. 3. Small joint effusion at the interphalangeal joint of the great toe but I do not see any definite findings for septic arthritis. 4. No findings for myofasciitis or pyomyositis. Electronically Signed   By: Marijo Sanes M.D.   On: 02/28/2021 08:13   DG Chest Port 1 View  Result Date: 02/28/2021 CLINICAL DATA:  Cough. EXAM: PORTABLE CHEST 1 VIEW COMPARISON:  None. FINDINGS: The heart size and mediastinal contours are within normal limits. Both lungs are clear. The visualized skeletal structures are unremarkable. IMPRESSION: No active disease. Electronically Signed   By: Anner Crete M.D.   On: 02/28/2021 01:34   DG Foot 2 Views Right  Result Date:  02/27/2021 CLINICAL DATA:  fourth toe swelling and injury. evaluate for osteomyelitis EXAM: RIGHT FOOT - 2 VIEW COMPARISON:  X-ray right foot 01/24/2020 FINDINGS: Right fifth metatarsal amputation-Clear margins, no definite cortical erosion or destruction. Subcutaneus soft tissue edema and emphysema along the distal fourth digit with query trace cortical erosion of the phalangeal tuft-limited evaluation with no oblique view obtained. There is no evidence of fracture or dislocation. No severe degenerative changes. IMPRESSION: Subcutaneus soft tissue edema and emphysema along the distal fourth digit with query trace cortical erosion of the phalangeal tuft-limited evaluation with no oblique view obtained. Consider further evaluation with an oblique view of the foot versus MRI for further evaluation (with intravenous contrast if GFR greater than 30). Electronically Signed   By: Iven Finn M.D.   On: 02/27/2021 18:20   DG Foot Complete Left  Result Date: 02/27/2021 CLINICAL DATA:  45 year old female with a great toe wound EXAM: LEFT FOOT - COMPLETE 3+ VIEW COMPARISON:  None. FINDINGS: No acute displaced fracture.  No erosive changes of the first digit. No subluxation/dislocation. Soft tissue swelling of the great toe.  No subcutaneous gas. No radiopaque foreign body. Medial calcinosis of the tibial arteries. IMPRESSION: Soft tissue swelling of the great toe compatible with cellulitis, with no evidence of osteomyelitis on  the plain film. Tibial artery atherosclerosis Electronically Signed   By: Gilmer Mor D.O.   On: 02/27/2021 16:33   - pertinent xrays, CT, MRI studies were reviewed and independently interpreted  Positive ROS: All other systems have been reviewed and were otherwise negative with the exception of those mentioned in the HPI and as above.  Physical Exam: General: Alert, no acute distress Psychiatric: Patient is competent for consent with normal mood and affect Lymphatic: No axillary or  cervical lymphadenopathy Cardiovascular: No pedal edema Respiratory: No cyanosis, no use of accessory musculature GI: No organomegaly, abdomen is soft and non-tender    Images:  @ENCIMAGES @  Labs:  Lab Results  Component Value Date   HGBA1C 10.5 (H) 02/27/2021   HGBA1C 11.0 (H) 01/25/2020   ESRSEDRATE 123 (H) 02/27/2021   ESRSEDRATE 86 (H) 01/27/2020   ESRSEDRATE 96 (H) 01/26/2020   CRP 22.6 (H) 02/27/2021   CRP 4.9 (H) 01/27/2020   CRP 8.6 (H) 01/26/2020   REPTSTATUS PENDING 02/27/2021   CULT  02/27/2021    NO GROWTH < 24 HOURS Performed at [facility], [address] [place], [place]     Lab Results  Component Value Date   ALBUMIN 3.3 (L) 01/24/2020   PREALBUMIN 12.1 (L) 01/25/2020     CBC EXTENDED Latest Ref Rng & Units 02/28/2021 02/27/2021 01/27/2020  WBC 4.0 - 10.5 K/uL 12.5(H) 15.2(H) 8.7  RBC 3.87 - 5.11 MIL/uL 3.81(L) 4.13 3.80(L)  HGB 12.0 - 15.0 g/dL 11.7(L) 12.7 11.9(L)  HCT 36.0 - 46.0 % 34.7(L) 37.5 34.7(L)  PLT 150 - 400 K/uL 294 324 285  NEUTROABS 1.7 - 7.7 K/uL - 12.8(H) 6.1  LYMPHSABS 0.7 - 4.0 K/uL - 1.4 1.7    Neurologic: Patient does not have protective sensation bilateral lower extremities.   MUSCULOSKELETAL:   Skin: Examination there is ulceration and cellulitis and swelling to both the left great toe and right fourth toe.  Patient has a strong palpable dorsalis pedis pulse bilaterally with good arterial inflow.  Review of the MRI scan shows abscess and osteomyelitis of the right fourth toe and left great toe.  White cell count is dropped from 15.2-12.5.  Albumin is 3.3 with a prealbumin of 12.1.  Hemoglobin A1c 10.5 with a sed rate of 123 and a C-reactive protein of 22.6.  Assessment: Abscess ulceration osteomyelitis left great toe and right fourth toe.  Plan: Discussed with the patient recommended to proceed with an amputation of the left great toe and right fourth toe.  Patient states she would like to discuss  this with her family.  Anticipate we could proceed with surgery Friday or Saturday morning.  Thank you for the consult and the opportunity to see Ms. Tuesday, MD Barb Merino [phone] 3:18 PM

## 2021-02-28 NOTE — Progress Notes (Signed)
Initial Nutrition Assessment  DOCUMENTATION CODES:   Not applicable  INTERVENTION:   Provided DM diet education via spanish interpreter [phone], [phone])   -1 packet Juven BID, each packet provides 95 calories, 2.5 grams of protein (collagen), and 9.8 grams of carbohydrate (3 grams sugar); also contains 7 grams of L-arginine and L-glutamine, 300 mg vitamin C, 15 mg vitamin E, 1.2 mcg vitamin B-12, 9.5 mg zinc, 200 mg calcium, and 1.5 g  Calcium Beta-hydroxy-Beta-methylbutyrate to support wound healing    NUTRITION DIAGNOSIS:   Increased nutrient needs related to  (wound) as evidenced by estimated needs.  GOAL:   Patient will meet greater than or equal to 90% of their needs  MONITOR:   PO intake, Supplement acceptance  REASON FOR ASSESSMENT:   Consult Diet education  ASSESSMENT:   Pt with PMH of uncontrolled DM type 2, R fifth ray amputation now admitted with abscess ulceration osteomyelitis of L great toe and R fourth toe.   Consult received for diet education  Plan for amputation of L great toe and R fourth toe.   Medications reviewed and include: SSI, novolog, semglee Labs reviewed: Na 130, A1C: 10.5 CBG's: 232-268    NUTRITION - FOCUSED PHYSICAL EXAM:  Flowsheet Row Most Recent Value  Orbital Region No depletion  Upper Arm Region No depletion  Thoracic and Lumbar Region No depletion  Buccal Region No depletion  Temple Region No depletion  Clavicle Bone Region No depletion  Clavicle and Acromion Bone Region No depletion  Scapular Bone Region Unable to assess  Dorsal Hand No depletion  Patellar Region No depletion  Anterior Thigh Region No depletion  Posterior Calf Region No depletion  Edema (RD Assessment) None  Hair Reviewed  Eyes Reviewed  Mouth Reviewed  Skin Reviewed  Nails Reviewed       Diet Order:   Diet Order             Diet NPO time specified Except for: Sips with Meds  Diet effective midnight           Diet NPO time specified   Diet effective ____           Diet Carb Modified Fluid consistency: Thin; Room service appropriate? Yes  Diet effective now                   EDUCATION NEEDS:   Education needs have been addressed  Skin:  Skin Assessment: Skin Integrity Issues: Skin Integrity Issues:: Diabetic Ulcer Diabetic Ulcer: L great toe, R fourth toe  Last BM:  unknown  Height:   Ht Readings from Last 1 Encounters:  02/27/21 5\' 2"  (1.575 m)    Weight:   Wt Readings from Last 1 Encounters:  02/27/21 77.1 kg    BMI:  Body mass index is 31.09 kg/m.  Estimated Nutritional Needs:   Kcal:  1700-1850  Protein:  100-115 grams  Fluid:  >1.7 L/day  03/01/21., RD, LDN, CNSC See AMiON for contact information

## 2021-02-28 NOTE — ED Notes (Signed)
Patient transported to MRI 

## 2021-02-28 NOTE — H&P (View-Only) (Signed)
ORTHOPAEDIC CONSULTATION  REQUESTING PHYSICIAN: Zannie CoveJoseph, Preetha, MD  Chief Complaint: Abscess ulceration left great toe and right fourth toe.  HPI: Alison Gates is a 45 y.o. female who presents with uncontrolled type 2 diabetes about a year status post right fifth ray amputation who presents with abscess cellulitis ulceration to the left great toe and right fourth toe.  Past Medical History:  Diagnosis Date   Diabetes (HCC) 10/2018   Past Surgical History:  Procedure Laterality Date   AMPUTATION Right 01/26/2020   Procedure: RIGHT FOOT LITTLE TOE AMPUTATION;  Surgeon: Nadara Mustarduda, Dare Sanger V, MD;  [facility];  Service: Orthopedics;  Laterality: Right;   Social History   Socioeconomic History   Marital status: Single    Spouse name: Not on file   Number of children: Not on file   Years of education: Not on file   Highest education level: Not on file  Occupational History   Not on file  Tobacco Use   Smoking status: Never   Smokeless tobacco: Never  Substance and Sexual Activity   Alcohol use: Not Currently   Drug use: Not Currently   Sexual activity: Not Currently  Other Topics Concern   Not on file  Social History Narrative   Not on file   Social Determinants of Health   Financial Resource Strain: Not on file  Food Insecurity: Not on file  Transportation Needs: Not on file  Physical Activity: Not on file  Stress: Not on file  Social Connections: Not on file   History reviewed. No pertinent family history. - negative except otherwise stated in the family history section No Known Allergies Prior to Admission medications   Medication Sig Start Date End Date Taking? Authorizing Provider  gabapentin (NEURONTIN) 100 MG capsule Take 1 capsule (100 mg total) by mouth 3 (three) times daily. 04/18/20   Persons, [place], PA  glipiZIDE (GLUCOTROL) 5 MG tablet TAKE 1 TABLET (5 MG TOTAL) BY MOUTH TWO TIMES DAILY. 01/27/20 01/26/21  Dorcas CarrowGhimire, Kuber, MD  ibuprofen (ADVIL)  200 MG tablet Take 400 mg by mouth every 6 (six) hours as needed for fever, headache or mild pain.    [provider]  metFORMIN (GLUCOPHAGE) 500 MG tablet TAKE 1 TABLET (500 MG TOTAL) BY MOUTH TWO TIMES DAILY WITH A MEAL. 01/27/20 01/26/21  Dorcas CarrowGhimire, Kuber, MD  vitamin B-12 (CYANOCOBALAMIN) 500 MCG tablet Take 500 mcg by mouth daily.    [provider]   MR FOOT RIGHT W WO CONTRAST  Result Date: 02/28/2021 CLINICAL DATA:  Pain and swelling of the right fourth toe. History of prior amputation of the fifth toe. EXAM: MRI OF THE RIGHT FOREFOOT WITHOUT AND WITH CONTRAST TECHNIQUE: Multiplanar, multisequence MR imaging of the right foot was performed before and after the administration of intravenous contrast. CONTRAST:  7mL GADAVIST GADOBUTROL 1 MMOL/ML IV SOLN COMPARISON:  MRI 01/25/2020 FINDINGS: Severe subcutaneous soft tissue swelling/edema and fluid involving the fourth toe. There is an open wound along the dorsum of the toe and gas in the soft tissues. Abnormal T1 and T2 signal intensity and advanced erosive changes involving the middle and distal phalanges consistent with osteomyelitis. Probable septic arthritis at the distal interphalangeal joint. After contrast administration there are areas of nonenhancing soft tissue which could reflect necrotic tissue. Prior amputation of the fifth toe and most of the fifth metatarsal. Moderate forefoot and midfoot degenerative changes but I do not see any other sites worrisome for septic arthritis or osteomyelitis. Mild diffuse subcutaneous  soft tissue swelling/edema mainly along the dorsum of the foot consistent with cellulitis. No findings for myofasciitis or pyomyositis. IMPRESSION: 1. Osteomyelitis involving the middle and distal phalanges of the fourth toe as detailed above. 2. Cellulitis without discrete drainable soft tissue abscess. 3. No findings for myofasciitis or pyomyositis. Electronically Signed   By: Marijo Sanes M.D.   On:  02/28/2021 08:07   MR FOOT LEFT W WO CONTRAST  Result Date: 02/28/2021 CLINICAL DATA:  Pain and swelling of the great toe. EXAM: MRI OF THE LEFT FOREFOOT WITHOUT AND WITH CONTRAST TECHNIQUE: Multiplanar, multisequence MR imaging of the left foot was performed both before and after administration of intravenous contrast. CONTRAST:  39mL GADAVIST GADOBUTROL 1 MMOL/ML IV SOLN COMPARISON:  Radiographs 02/27/2021 FINDINGS: There is marked soft tissue swelling/edema/fluid surrounding the great toe. I do not see an obvious open wound or gas in the soft tissues. Curvilinear fluid collection along the medial and plantar aspect of the great toe consistent with an abscess measuring a maximum of 2.4 cm. Abnormal T1 and T2 signal intensity and subsequent enhancement in the distal phalanx consistent with osteomyelitis. There is a small joint effusion at the interphalangeal joint but I do not see any definite findings for septic arthritis. The other bony structures are intact. No other sites of osteomyelitis are identified. No findings for myofasciitis or pyomyositis. IMPRESSION: 1. 2.4 cm abscess along the medial and plantar aspect of the great toe with significant surrounding cellulitis. 2. Osteomyelitis involving the distal phalanx of the great toe. 3. Small joint effusion at the interphalangeal joint of the great toe but I do not see any definite findings for septic arthritis. 4. No findings for myofasciitis or pyomyositis. Electronically Signed   By: Marijo Sanes M.D.   On: 02/28/2021 08:13   DG Chest Port 1 View  Result Date: 02/28/2021 CLINICAL DATA:  Cough. EXAM: PORTABLE CHEST 1 VIEW COMPARISON:  None. FINDINGS: The heart size and mediastinal contours are within normal limits. Both lungs are clear. The visualized skeletal structures are unremarkable. IMPRESSION: No active disease. Electronically Signed   By: Anner Crete M.D.   On: 02/28/2021 01:34   DG Foot 2 Views Right  Result Date:  02/27/2021 CLINICAL DATA:  fourth toe swelling and injury. evaluate for osteomyelitis EXAM: RIGHT FOOT - 2 VIEW COMPARISON:  X-ray right foot 01/24/2020 FINDINGS: Right fifth metatarsal amputation-Clear margins, no definite cortical erosion or destruction. Subcutaneus soft tissue edema and emphysema along the distal fourth digit with query trace cortical erosion of the phalangeal tuft-limited evaluation with no oblique view obtained. There is no evidence of fracture or dislocation. No severe degenerative changes. IMPRESSION: Subcutaneus soft tissue edema and emphysema along the distal fourth digit with query trace cortical erosion of the phalangeal tuft-limited evaluation with no oblique view obtained. Consider further evaluation with an oblique view of the foot versus MRI for further evaluation (with intravenous contrast if GFR greater than 30). Electronically Signed   By: Iven Finn M.D.   On: 02/27/2021 18:20   DG Foot Complete Left  Result Date: 02/27/2021 CLINICAL DATA:  45 year old female with a great toe wound EXAM: LEFT FOOT - COMPLETE 3+ VIEW COMPARISON:  None. FINDINGS: No acute displaced fracture.  No erosive changes of the first digit. No subluxation/dislocation. Soft tissue swelling of the great toe.  No subcutaneous gas. No radiopaque foreign body. Medial calcinosis of the tibial arteries. IMPRESSION: Soft tissue swelling of the great toe compatible with cellulitis, with no evidence of osteomyelitis on  the plain film. Tibial artery atherosclerosis Electronically Signed   By: Gilmer Mor D.O.   On: 02/27/2021 16:33   - pertinent xrays, CT, MRI studies were reviewed and independently interpreted  Positive ROS: All other systems have been reviewed and were otherwise negative with the exception of those mentioned in the HPI and as above.  Physical Exam: General: Alert, no acute distress Psychiatric: Patient is competent for consent with normal mood and affect Lymphatic: No axillary or  cervical lymphadenopathy Cardiovascular: No pedal edema Respiratory: No cyanosis, no use of accessory musculature GI: No organomegaly, abdomen is soft and non-tender    Images:  @ENCIMAGES @  Labs:  Lab Results  Component Value Date   HGBA1C 10.5 (H) 02/27/2021   HGBA1C 11.0 (H) 01/25/2020   ESRSEDRATE 123 (H) 02/27/2021   ESRSEDRATE 86 (H) 01/27/2020   ESRSEDRATE 96 (H) 01/26/2020   CRP 22.6 (H) 02/27/2021   CRP 4.9 (H) 01/27/2020   CRP 8.6 (H) 01/26/2020   REPTSTATUS PENDING 02/27/2021   CULT  02/27/2021    NO GROWTH < 24 HOURS Performed at [facility], [address] [place], [place]     Lab Results  Component Value Date   ALBUMIN 3.3 (L) 01/24/2020   PREALBUMIN 12.1 (L) 01/25/2020     CBC EXTENDED Latest Ref Rng & Units 02/28/2021 02/27/2021 01/27/2020  WBC 4.0 - 10.5 K/uL 12.5(H) 15.2(H) 8.7  RBC 3.87 - 5.11 MIL/uL 3.81(L) 4.13 3.80(L)  HGB 12.0 - 15.0 g/dL 11.7(L) 12.7 11.9(L)  HCT 36.0 - 46.0 % 34.7(L) 37.5 34.7(L)  PLT 150 - 400 K/uL 294 324 285  NEUTROABS 1.7 - 7.7 K/uL - 12.8(H) 6.1  LYMPHSABS 0.7 - 4.0 K/uL - 1.4 1.7    Neurologic: Patient does not have protective sensation bilateral lower extremities.   MUSCULOSKELETAL:   Skin: Examination there is ulceration and cellulitis and swelling to both the left great toe and right fourth toe.  Patient has a strong palpable dorsalis pedis pulse bilaterally with good arterial inflow.  Review of the MRI scan shows abscess and osteomyelitis of the right fourth toe and left great toe.  White cell count is dropped from 15.2-12.5.  Albumin is 3.3 with a prealbumin of 12.1.  Hemoglobin A1c 10.5 with a sed rate of 123 and a C-reactive protein of 22.6.  Assessment: Abscess ulceration osteomyelitis left great toe and right fourth toe.  Plan: Discussed with the patient recommended to proceed with an amputation of the left great toe and right fourth toe.  Patient states she would like to discuss  this with her family.  Anticipate we could proceed with surgery Friday or Saturday morning.  Thank you for the consult and the opportunity to see Ms. Tuesday, MD Barb Merino [phone] 3:18 PM

## 2021-02-28 NOTE — Consult Note (Signed)
WOC Nurse Consult Note: Patient receiving care in [facility] 6N10 Reason for Consult: cellulitis and crustations and abrasion like area rt foot, cellulitis lt foot Wound type: Cellulitis of the left great toe and right fourth toe. MRI results complete and are consistent with osteomyelitis which is out of the WOC scope of practice. Dr. Lajoyce Corners has evaluated and plans to proceed with amputation of the left great toe and right fourth toe on Friday or Saturday if patient consents. Bedside RN may apply small foam dressing around the toes while the patient decides on POC.  WOC will not follow but are available to this patient and medical team. Please re-consult if needed.  Renaldo Reel Katrinka Blazing, MSN, RN, CMSRN, Angus Seller, [facility] Wound Treatment Associate Pager [phone]

## 2021-02-28 NOTE — Progress Notes (Signed)
PROGRESS NOTE    Alison Gates  DPO:[id number] DOB: 1975/07/05 DOA: 02/27/2021 PCP: Pcp, No  Brief Narrative: 45/F with history of poorly controlled diabetes mellitus, prior history of osteomyelitis and right fifth toe amputation in 10/21, poor compliance with medications presented to the ED with pain and swelling and drainage involving left great toe and right fifth toe. -X-rays noted soft tissue swelling -Labs were notable for blood glucose of 501 without DKA, ESR and CRP significantly elevated   Assessment & Plan:   Sepsis, poa Suspected osteomyelitis -Involving right fourth toe and left great toe -Prior fifth toe amputation -Orthopedics consulted, ESR and CRP significantly elevated -Continue vancomycin and ceftriaxone -Follow-up MRI scan  Type 2 diabetes mellitus -Recent hemoglobin A1c was 12.5 and June -Poorly compliant with meds -Continue Semglee, sliding scale insulin, increased dose -Follow-up repeat A1c  DVT prophylaxis: Add Lovenox Code Status: Full code Family Communication: Discussed patient in detail, no family at bedside Disposition Plan:  Status is: Inpatient  Remains inpatient appropriate because: Severity of illness  Consultants:    Procedures:   Antimicrobials:    Subjective: -Feels okay, resting comfortably, has less sensations in her foot  Objective: Vitals:   02/28/21 0200 02/28/21 0501 02/28/21 0554 02/28/21 0718  BP: 106/63 109/65 115/72 (!) 103/44  Pulse: 95 96 90 87  Resp: (!) _0 Temp:  98.5 F (36.9 C) 98 F (36.7 C) 98.5 F (36.9 C)  TempSrc:  Oral Oral Oral  SpO2: 95% 98% 99% 98%  Weight:      Height:        Intake/Output Summary (Last 24 hours) at 02/28/2021 1445 Last data filed at 02/28/2021 0126 Gross per 24 hour  Intake 2400 ml  Output --  Net 2400 ml   Filed Weights   02/27/21 1608  Weight: 77.1 kg    Examination:  General exam: Appears calm and comfortable  Respiratory system: Clear to  auscultation Cardiovascular system: S1 & S2 heard, RRR.  Abd: nondistended, soft and nontender.Normal bowel sounds heard. Central nervous system: Alert and oriented. No focal neurological deficits. Extremities: Swelling erythema and tenderness involving left great toe and right fifth toe, bounding dorsalis pedis pulses Skin: As above Psychiatry: Judgement and insight appear normal. Mood & affect appropriate.   Data Reviewed:   CBC: Recent Labs  Lab 02/27/21 1610 02/28/21 0500  WBC 15.2* 12.5*  NEUTROABS 12.8*  --   HGB 12.7 11.7*  HCT 37.5 34.7*  MCV 90.8 91.1  PLT 324 431   Basic Metabolic Panel: Recent Labs  Lab 02/27/21 1610  NA 130*  K 4.6  CL 94*  CO2 27  GLUCOSE 501*  BUN 10  CREATININE 0.69  CALCIUM 9.2   GFR: Estimated Creatinine Clearance: 86.3 mL/min (by C-G formula based on SCr of 0.69 mg/dL). Liver Function Tests: No results for input(s): AST, ALT, ALKPHOS, BILITOT, PROT, ALBUMIN in the last 168 hours. No results for input(s): LIPASE, AMYLASE in the last 168 hours. No results for input(s): AMMONIA in the last 168 hours. Coagulation Profile: No results for input(s): INR, PROTIME in the last 168 hours. Cardiac Enzymes: No results for input(s): CKTOTAL, CKMB, CKMBINDEX, TROPONINI in the last 168 hours. BNP (last 3 results) No results for input(s): PROBNP in the last 8760 hours. HbA1C: Recent Labs    02/27/21 2027  HGBA1C 10.5*   CBG: Recent Labs  Lab 02/27/21 2115 02/27/21 2128 02/28/21 1213  GLUCAP 232* 236* 240*   Lipid Profile: Recent Labs  02/28/21 0500  CHOL 127  HDL 38*  LDLCALC 71  TRIG 91  CHOLHDL 3.3   Thyroid Function Tests: No results for input(s): TSH, T4TOTAL, FREET4, T3FREE, THYROIDAB in the last 72 hours. Anemia Panel: No results for input(s): VITAMINB12, FOLATE, FERRITIN, TIBC, IRON, RETICCTPCT in the last 72 hours. Urine analysis: No results found for: COLORURINE, APPEARANCEUR, [place], [place], GLUCOSEU, [place],  BILIRUBINUR, [place], [place], [place], NITRITE, LEUKOCYTESUR Sepsis Labs: _0 (procalcitonin:4,lacticidven:4)  ) Recent Results (from the past 240 hour(s))  Blood culture (routine x 2)     Status: None (Preliminary result)   Collection Time: 02/27/21  5:26 PM   Specimen: BLOOD RIGHT WRIST  Result Value Ref Range Status   Specimen Description BLOOD RIGHT WRIST  Final   Special Requests   Final    BOTTLES DRAWN AEROBIC AND ANAEROBIC Blood Culture results may not be optimal due to an inadequate volume of blood received in culture bottles   Culture   Final    NO GROWTH < 24 HOURS Performed at [facility], [address], [place], [place] [address]    Report Status PENDING  Incomplete  Blood culture (routine x 2)     Status: None (Preliminary result)   Collection Time: 02/27/21  5:54 PM   Specimen: Site Not Specified; Blood  Result Value Ref Range Status   Specimen Description SITE NOT SPECIFIED  Final   Special Requests   Final    BOTTLES DRAWN AEROBIC AND ANAEROBIC Blood Culture results may not be optimal due to an excessive volume of blood received in culture bottles   Culture   Final    NO GROWTH < 24 HOURS Performed at [place] [address] [place],  [address]    Report Status PENDING  Incomplete  Resp Panel by RT-PCR (Flu A&B, Covid) Nasopharyngeal Swab     Status: None   Collection Time: 02/28/21  1:14 AM   Specimen: Nasopharyngeal Swab; Nasopharyngeal(NP) swabs in vial transport medium  Result Value Ref Range Status   SARS Coronavirus 2 by RT PCR NEGATIVE NEGATIVE Final    Comment: (NOTE) SARS-CoV-2 target nucleic acids are NOT DETECTED.  The SARS-CoV-2 RNA is generally detectable in upper respiratory specimens during the acute phase of infection. The lowest concentration of SARS-CoV-2 viral copies this assay can detect is 138 copies/mL. A negative result does not preclude SARS-Cov-2 infection and should not be used as the sole  basis for treatment or other patient management decisions. A negative result may occur with  improper specimen collection/handling, submission of specimen other than nasopharyngeal swab, presence of viral mutation(s) within the areas targeted by this assay, and inadequate number of viral copies(<138 copies/mL). A negative result must be combined with clinical observations, patient history, and epidemiological information. The expected result is Negative.  Fact Sheet for Patients:  EntrepreneurPulse.com.au  Fact Sheet for Healthcare Providers:  IncredibleEmployment.be  This test is no t yet approved or cleared by the [place] FDA and  has been authorized for detection and/or diagnosis of SARS-CoV-2 by FDA under an Emergency Use Authorization (EUA). This EUA will remain  in effect (meaning this test can be used) for the duration of the COVID-19 declaration under Section 564(b)(1) of the Act, 21 U.S.C.section 360bbb-3(b)(1), unless the authorization is terminated  or revoked sooner.       Influenza A by PCR NEGATIVE NEGATIVE Final   Influenza B by PCR NEGATIVE NEGATIVE Final    Comment: (NOTE) The Xpert Xpress SARS-CoV-2/FLU/RSV plus  assay is intended as an aid in the diagnosis of influenza from Nasopharyngeal swab specimens and should not be used as a sole basis for treatment. Nasal washings and aspirates are unacceptable for Xpert Xpress SARS-CoV-2/FLU/RSV testing.  Fact Sheet for Patients: EntrepreneurPulse.com.au  Fact Sheet for Healthcare Providers: IncredibleEmployment.be  This test is not yet approved or cleared by the [place] FDA and has been authorized for detection and/or diagnosis of SARS-CoV-2 by FDA under an Emergency Use Authorization (EUA). This EUA will remain in effect (meaning this test can be used) for the duration of the COVID-19 declaration under Section 564(b)(1) of the Act,  21 U.S.C. section 360bbb-3(b)(1), unless the authorization is terminated or revoked.  Performed at [facility], [place] [address] [place], [place] [address]          Radiology Studies: MR FOOT RIGHT W WO CONTRAST  Result Date: 02/28/2021 CLINICAL DATA:  Pain and swelling of the right fourth toe. History of prior amputation of the fifth toe. EXAM: MRI OF THE RIGHT FOREFOOT WITHOUT AND WITH CONTRAST TECHNIQUE: Multiplanar, multisequence MR imaging of the right foot was performed before and after the administration of intravenous contrast. CONTRAST:  16mL GADAVIST GADOBUTROL 1 MMOL/ML IV SOLN COMPARISON:  MRI 01/25/2020 FINDINGS: Severe subcutaneous soft tissue swelling/edema and fluid involving the fourth toe. There is an open wound along the dorsum of the toe and gas in the soft tissues. Abnormal T1 and T2 signal intensity and advanced erosive changes involving the middle and distal phalanges consistent with osteomyelitis. Probable septic arthritis at the distal interphalangeal joint. After contrast administration there are areas of nonenhancing soft tissue which could reflect necrotic tissue. Prior amputation of the fifth toe and most of the fifth metatarsal. Moderate forefoot and midfoot degenerative changes but I do not see any other sites worrisome for septic arthritis or osteomyelitis. Mild diffuse subcutaneous soft tissue swelling/edema mainly along the dorsum of the foot consistent with cellulitis. No findings for myofasciitis or pyomyositis. IMPRESSION: 1. Osteomyelitis involving the middle and distal phalanges of the fourth toe as detailed above. 2. Cellulitis without discrete drainable soft tissue abscess. 3. No findings for myofasciitis or pyomyositis. Electronically Signed   By: Marijo Sanes M.D.   On: 02/28/2021 08:07   MR FOOT LEFT W WO CONTRAST  Result Date: 02/28/2021 CLINICAL DATA:  Pain and swelling of the great toe. EXAM: MRI OF THE LEFT FOREFOOT WITHOUT AND WITH CONTRAST  TECHNIQUE: Multiplanar, multisequence MR imaging of the left foot was performed both before and after administration of intravenous contrast. CONTRAST:  58mL GADAVIST GADOBUTROL 1 MMOL/ML IV SOLN COMPARISON:  Radiographs 02/27/2021 FINDINGS: There is marked soft tissue swelling/edema/fluid surrounding the great toe. I do not see an obvious open wound or gas in the soft tissues. Curvilinear fluid collection along the medial and plantar aspect of the great toe consistent with an abscess measuring a maximum of 2.4 cm. Abnormal T1 and T2 signal intensity and subsequent enhancement in the distal phalanx consistent with osteomyelitis. There is a small joint effusion at the interphalangeal joint but I do not see any definite findings for septic arthritis. The other bony structures are intact. No other sites of osteomyelitis are identified. No findings for myofasciitis or pyomyositis. IMPRESSION: 1. 2.4 cm abscess along the medial and plantar aspect of the great toe with significant surrounding cellulitis. 2. Osteomyelitis involving the distal phalanx of the great toe. 3. Small joint effusion at the interphalangeal joint of the great toe but I do not see  any definite findings for septic arthritis. 4. No findings for myofasciitis or pyomyositis. Electronically Signed   By: Marijo Sanes M.D.   On: 02/28/2021 08:13   DG Chest Port 1 View  Result Date: 02/28/2021 CLINICAL DATA:  Cough. EXAM: PORTABLE CHEST 1 VIEW COMPARISON:  None. FINDINGS: The heart size and mediastinal contours are within normal limits. Both lungs are clear. The visualized skeletal structures are unremarkable. IMPRESSION: No active disease. Electronically Signed   By: Anner Crete M.D.   On: 02/28/2021 01:34   DG Foot 2 Views Right  Result Date: 02/27/2021 CLINICAL DATA:  fourth toe swelling and injury. evaluate for osteomyelitis EXAM: RIGHT FOOT - 2 VIEW COMPARISON:  X-ray right foot 01/24/2020 FINDINGS: Right fifth metatarsal amputation-Clear  margins, no definite cortical erosion or destruction. Subcutaneus soft tissue edema and emphysema along the distal fourth digit with query trace cortical erosion of the phalangeal tuft-limited evaluation with no oblique view obtained. There is no evidence of fracture or dislocation. No severe degenerative changes. IMPRESSION: Subcutaneus soft tissue edema and emphysema along the distal fourth digit with query trace cortical erosion of the phalangeal tuft-limited evaluation with no oblique view obtained. Consider further evaluation with an oblique view of the foot versus MRI for further evaluation (with intravenous contrast if GFR greater than 30). Electronically Signed   By: Iven Finn M.D.   On: 02/27/2021 18:20   DG Foot Complete Left  Result Date: 02/27/2021 CLINICAL DATA:  45 year old female with a great toe wound EXAM: LEFT FOOT - COMPLETE 3+ VIEW COMPARISON:  None. FINDINGS: No acute displaced fracture.  No erosive changes of the first digit. No subluxation/dislocation. Soft tissue swelling of the great toe.  No subcutaneous gas. No radiopaque foreign body. Medial calcinosis of the tibial arteries. IMPRESSION: Soft tissue swelling of the great toe compatible with cellulitis, with no evidence of osteomyelitis on the plain film. Tibial artery atherosclerosis Electronically Signed   By: Corrie Mckusick D.O.   On: 02/27/2021 16:33    Scheduled Meds:  insulin aspart  0-15 Units Subcutaneous TID WC   insulin glargine-yfgn  10 Units Subcutaneous QHS   insulin starter kit- pen needles  1 kit Other Once   living well with diabetes book- in spanish   Does not apply Once   Continuous Infusions:  cefTRIAXone (ROCEPHIN)  IV     vancomycin 1,250 mg (02/28/21 1137)     LOS: 1 day    Time spent: 32mn  PDomenic Polite MD [facility]   02/28/2021, 2:45 PM

## 2021-02-28 NOTE — Consult Note (Signed)
Reason for Consult:Diabetic foot ulcers Referring Physician: Domenic Polite Time called: [id number] Time at bedside: [place] is an 45 y.o. female.  HPI: Stefane comes in with a 2 week hx/o bilateral toe ulcerations, swelling, and pain. She denies any trauma, fevers, chills, sweats, N/V. She admits to similar hx/o ulceration of right 5th toe that required amputation. She is very reluctant to undergo any further amputations.  Past Medical History:  Diagnosis Date   Diabetes ([place]) 10/2018    Past Surgical History:  Procedure Laterality Date   AMPUTATION Right 01/26/2020   Procedure: RIGHT FOOT LITTLE TOE AMPUTATION;  Surgeon: Newt Minion, MD;  Location: [facility];  Service: Orthopedics;  Laterality: Right;    History reviewed. No pertinent family history.  Social History:  reports that she has never smoked. She has never used smokeless tobacco. She reports that she does not currently use alcohol. She reports that she does not currently use drugs.  Allergies: No Known Allergies  Medications: I have reviewed the patient's current medications.  Results for orders placed or performed during the hospital encounter of 02/27/21 (from the past 48 hour(s))  CBC with Differential     Status: Abnormal   Collection Time: 02/27/21  4:10 PM  Result Value Ref Range   WBC 15.2 (H) 4.0 - 10.5 K/uL   RBC 4.13 3.87 - 5.11 MIL/uL   Hemoglobin 12.7 12.0 - 15.0 g/dL   HCT 37.5 36.0 - 46.0 %   MCV 90.8 80.0 - 100.0 fL   MCH 30.8 26.0 - 34.0 pg   MCHC 33.9 30.0 - 36.0 g/dL   RDW 11.5 11.5 - 15.5 %   Platelets 324 150 - 400 K/uL   nRBC 0.0 0.0 - 0.2 %   Neutrophils Relative % 84 %   Neutro Abs 12.8 (H) 1.7 - 7.7 K/uL   Lymphocytes Relative 9 %   Lymphs Abs 1.4 0.7 - 4.0 K/uL   Monocytes Relative 6 %   Monocytes Absolute 0.9 0.1 - 1.0 K/uL   Eosinophils Relative 0 %   Eosinophils Absolute 0.0 0.0 - 0.5 K/uL   Basophils Relative 0 %   Basophils Absolute 0.0 0.0 - 0.1 K/uL   Immature  Granulocytes 1 %   Abs Immature Granulocytes 0.07 0.00 - 0.07 K/uL    Comment: Performed at [facility], [address], [place], [place] [id number]  Basic metabolic panel     Status: Abnormal   Collection Time: 02/27/21  4:10 PM  Result Value Ref Range   Sodium 130 (L) 135 - 145 mmol/L   Potassium 4.6 3.5 - 5.1 mmol/L   Chloride 94 (L) 98 - 111 mmol/L   CO2 27 22 - 32 mmol/L   Glucose, Bld 501 (HH) 70 - 99 mg/dL    Comment: Glucose reference range applies only to samples taken after fasting for at least 8 hours. CRITICAL RESULT CALLED TO, READ BACK BY AND VERIFIED WITH: J BONEY,RN [id number] 02/27/2021 WBOND    BUN 10 6 - 20 mg/dL   Creatinine, Ser 0.69 0.44 - 1.00 mg/dL   Calcium 9.2 8.9 - 10.3 mg/dL   GFR, Estimated >60 >60 mL/min    Comment: (NOTE) Calculated using the CKD-EPI Creatinine Equation (2021)    Anion gap 9 5 - 15    Comment: Performed at [facility] [address] [place], [place] [address]  Lactic acid, plasma     Status: Abnormal   Collection Time: 02/27/21  5:03 PM  Result Value Ref Range   Lactic Acid, Venous 2.1 (HH) 0.5 - 1.9 mmol/L    Comment: CRITICAL RESULT CALLED TO, READ BACK BY AND VERIFIED WITH: Naoma Diener [id number] 02/27/2021 WBOND Performed at [facility], [place] [address], [place], [place] [address]   Blood culture (routine x 2)     Status: None (Preliminary result)   Collection Time: 02/27/21  5:26 PM   Specimen: BLOOD RIGHT WRIST  Result Value Ref Range   Specimen Description BLOOD RIGHT WRIST    Special Requests      BOTTLES DRAWN AEROBIC AND ANAEROBIC Blood Culture results may not be optimal due to an inadequate volume of blood received in culture bottles   Culture      NO GROWTH < 24 HOURS Performed at [facility] [address] [place], [place] [address]    Report Status PENDING   Blood culture (routine x 2)     Status: None (Preliminary result)   Collection Time: 02/27/21  5:54 PM   Specimen: Site Not Specified;  Blood  Result Value Ref Range   Specimen Description SITE NOT SPECIFIED    Special Requests      BOTTLES DRAWN AEROBIC AND ANAEROBIC Blood Culture results may not be optimal due to an excessive volume of blood received in culture bottles   Culture      NO GROWTH < 24 HOURS Performed at [facility] [address], [place], [place] [address]    Report Status PENDING   Lactic acid, plasma     Status: None   Collection Time: 02/27/21  8:27 PM  Result Value Ref Range   Lactic Acid, Venous 1.1 0.5 - 1.9 mmol/L    Comment: Performed at [facility] [address], [place], [place] [address]  Sedimentation rate     Status: Abnormal   Collection Time: 02/27/21  8:27 PM  Result Value Ref Range   Sed Rate 123 (H) 0 - 22 mm/hr    Comment: Performed at [facility] [address], [place], [place] [address]  C-reactive protein     Status: Abnormal   Collection Time: 02/27/21  8:27 PM  Result Value Ref Range   CRP 22.6 (H) <1.0 mg/dL    Comment: Performed at [facility], [place] [address], [place], [place] [address]  HIV Antibody (routine testing w rflx)     Status: None   Collection Time: 02/27/21  8:27 PM  Result Value Ref Range   HIV Screen 4th Generation wRfx Non Reactive Non Reactive    Comment: Performed at [facility], [place] [address], [place], [place] [address]  Hemoglobin A1c     Status: Abnormal   Collection Time: 02/27/21  8:27 PM  Result Value Ref Range   Hgb A1c MFr Bld 10.5 (H) 4.8 - 5.6 %    Comment: (NOTE) Pre diabetes:          5.7%-6.4%  Diabetes:              >6.4%  Glycemic control for   <7.0% adults with diabetes    Mean Plasma Glucose 254.65 mg/dL    Comment: Performed at [facility] [address], [place], [place] [address]  POC CBG, ED     Status: Abnormal   Collection Time: 02/27/21  9:15 PM  Result Value Ref Range   Glucose-Capillary 232 (H) 70 - 99 mg/dL  Comment: Glucose reference range applies only to samples  taken after fasting for at least 8 hours.  CBG monitoring, ED     Status: Abnormal   Collection Time: 02/27/21  9:28 PM  Result Value Ref Range   Glucose-Capillary 236 (H) 70 - 99 mg/dL    Comment: Glucose reference range applies only to samples taken after fasting for at least 8 hours.   Comment 1 Notify RN    Comment 2 Document in Chart   Resp Panel by RT-PCR (Flu A&B, Covid) Nasopharyngeal Swab     Status: None   Collection Time: 02/28/21  1:14 AM   Specimen: Nasopharyngeal Swab; Nasopharyngeal(NP) swabs in vial transport medium  Result Value Ref Range   SARS Coronavirus 2 by RT PCR NEGATIVE NEGATIVE    Comment: (NOTE) SARS-CoV-2 target nucleic acids are NOT DETECTED.  The SARS-CoV-2 RNA is generally detectable in upper respiratory specimens during the acute phase of infection. The lowest concentration of SARS-CoV-2 viral copies this assay can detect is 138 copies/mL. A negative result does not preclude SARS-Cov-2 infection and should not be used as the sole basis for treatment or other patient management decisions. A negative result may occur with  improper specimen collection/handling, submission of specimen other than nasopharyngeal swab, presence of viral mutation(s) within the areas targeted by this assay, and inadequate number of viral copies(<138 copies/mL). A negative result must be combined with clinical observations, patient history, and epidemiological information. The expected result is Negative.  Fact Sheet for Patients:  EntrepreneurPulse.com.au  Fact Sheet for Healthcare Providers:  IncredibleEmployment.be  This test is no t yet approved or cleared by the [place] FDA and  has been authorized for detection and/or diagnosis of SARS-CoV-2 by FDA under an Emergency Use Authorization (EUA). This EUA will remain  in effect (meaning this test can be used) for the duration of the COVID-19 declaration under Section 564(b)(1) of  the Act, 21 U.S.C.section 360bbb-3(b)(1), unless the authorization is terminated  or revoked sooner.       Influenza A by PCR NEGATIVE NEGATIVE   Influenza B by PCR NEGATIVE NEGATIVE    Comment: (NOTE) The Xpert Xpress SARS-CoV-2/FLU/RSV plus assay is intended as an aid in the diagnosis of influenza from Nasopharyngeal swab specimens and should not be used as a sole basis for treatment. Nasal washings and aspirates are unacceptable for Xpert Xpress SARS-CoV-2/FLU/RSV testing.  Fact Sheet for Patients: EntrepreneurPulse.com.au  Fact Sheet for Healthcare Providers: IncredibleEmployment.be  This test is not yet approved or cleared by the [place] FDA and has been authorized for detection and/or diagnosis of SARS-CoV-2 by FDA under an Emergency Use Authorization (EUA). This EUA will remain in effect (meaning this test can be used) for the duration of the COVID-19 declaration under Section 564(b)(1) of the Act, 21 U.S.C. section 360bbb-3(b)(1), unless the authorization is terminated or revoked.  Performed at [facility], [place] [address] [place], [place] [address]   CBC     Status: Abnormal   Collection Time: 02/28/21  5:00 AM  Result Value Ref Range   WBC 12.5 (H) 4.0 - 10.5 K/uL   RBC 3.81 (L) 3.87 - 5.11 MIL/uL   Hemoglobin 11.7 (L) 12.0 - 15.0 g/dL   HCT 34.7 (L) 36.0 - 46.0 %   MCV 91.1 80.0 - 100.0 fL   MCH 30.7 26.0 - 34.0 pg   MCHC 33.7 30.0 - 36.0 g/dL   RDW 11.6 11.5 - 15.5 %   Platelets 294 150 -  400 K/uL   nRBC 0.0 0.0 - 0.2 %    Comment: Performed at [facility], [address] [place], [place] [address]  Lipid panel     Status: Abnormal   Collection Time: 02/28/21  5:00 AM  Result Value Ref Range   Cholesterol 127 0 - 200 mg/dL   Triglycerides 91 <967 mg/dL   HDL 38 (L) >59 mg/dL   Total CHOL/HDL Ratio 3.3 RATIO   VLDL 18 0 - 40 mg/dL   LDL Cholesterol 71 0 - 99 mg/dL    Comment:        Total  Cholesterol/HDL:CHD Risk Coronary Heart Disease Risk Table                     Men   Women  1/2 Average Risk   3.4   3.3  Average Risk       5.0   4.4  2 X Average Risk   9.6   7.1  3 X Average Risk  23.4   11.0        Use the calculated Patient Ratio above and the CHD Risk Table to determine the patient's CHD Risk.        ATP III CLASSIFICATION (LDL):  <100     mg/dL   Optimal  163-846  mg/dL   Near or Above                    Optimal  130-159  mg/dL   Borderline  659-935  mg/dL   High  >701     mg/dL   Very High Performed at [facility], [address], [place], [place] [address]   Glucose, capillary     Status: Abnormal   Collection Time: 02/28/21 12:13 PM  Result Value Ref Range   Glucose-Capillary 240 (H) 70 - 99 mg/dL    Comment: Glucose reference range applies only to samples taken after fasting for at least 8 hours.   Comment 1 Notify RN     DG Chest Port 1 View  Result Date: 02/28/2021 CLINICAL DATA:  Cough. EXAM: PORTABLE CHEST 1 VIEW COMPARISON:  None. FINDINGS: The heart size and mediastinal contours are within normal limits. Both lungs are clear. The visualized skeletal structures are unremarkable. IMPRESSION: No active disease. Electronically Signed   By: Elgie Collard M.D.   On: 02/28/2021 01:34   DG Foot 2 Views Right  Result Date: 02/27/2021 CLINICAL DATA:  fourth toe swelling and injury. evaluate for osteomyelitis EXAM: RIGHT FOOT - 2 VIEW COMPARISON:  X-ray right foot 01/24/2020 FINDINGS: Right fifth metatarsal amputation-Clear margins, no definite cortical erosion or destruction. Subcutaneus soft tissue edema and emphysema along the distal fourth digit with query trace cortical erosion of the phalangeal tuft-limited evaluation with no oblique view obtained. There is no evidence of fracture or dislocation. No severe degenerative changes. IMPRESSION: Subcutaneus soft tissue edema and emphysema along the distal fourth digit with query trace cortical  erosion of the phalangeal tuft-limited evaluation with no oblique view obtained. Consider further evaluation with an oblique view of the foot versus MRI for further evaluation (with intravenous contrast if GFR greater than 30). Electronically Signed   By: Tish Frederickson M.D.   On: 02/27/2021 18:20   DG Foot Complete Left  Result Date: 02/27/2021 CLINICAL DATA:  45 year old female with a great toe wound EXAM: LEFT FOOT - COMPLETE 3+ VIEW COMPARISON:  None. FINDINGS: No acute displaced fracture.  No  erosive changes of the first digit. No subluxation/dislocation. Soft tissue swelling of the great toe.  No subcutaneous gas. No radiopaque foreign body. Medial calcinosis of the tibial arteries. IMPRESSION: Soft tissue swelling of the great toe compatible with cellulitis, with no evidence of osteomyelitis on the plain film. Tibial artery atherosclerosis Electronically Signed   By: Corrie Mckusick D.O.   On: 02/27/2021 16:33    Review of Systems  Constitutional:  Negative for chills, diaphoresis and fever.  HENT:  Negative for ear discharge, ear pain, hearing loss and tinnitus.   Eyes:  Negative for photophobia and pain.  Respiratory:  Negative for cough and shortness of breath.   Cardiovascular:  Negative for chest pain.  Gastrointestinal:  Negative for abdominal pain, nausea and vomiting.  Genitourinary:  Negative for dysuria, flank pain, frequency and urgency.  Musculoskeletal:  Positive for arthralgias (Bilateral toes). Negative for back pain, myalgias and neck pain.  Neurological:  Negative for dizziness and headaches.  Hematological:  Does not bruise/bleed easily.  Psychiatric/Behavioral:  The patient is not nervous/anxious.   Blood pressure (!) 103/44, pulse 87, temperature 98.5 F (36.9 C), temperature source Oral, resp. rate 16, height 5\' 2"  (1.575 m), weight 77.1 kg, SpO2 98 %. Physical Exam Constitutional:      General: She is not in acute distress.    Appearance: She is well-developed. She  is not diaphoretic.  HENT:     Head: Normocephalic and atraumatic.  Eyes:     General: No scleral icterus.       Right eye: No discharge.        Left eye: No discharge.     Conjunctiva/sclera: Conjunctivae normal.  Cardiovascular:     Rate and Rhythm: Normal rate and regular rhythm.  Pulmonary:     Effort: Pulmonary effort is normal. No respiratory distress.  Musculoskeletal:     Cervical back: Normal range of motion.  Feet:     Comments: Right: Ulceration over 4th MTP joint with fusiform edema 4th toe. 2+ NP edema. 2+ DP. SPN/DPN/TN intact.  Left: Ulceration on lateral great toe with fusiform edema, 1+ edema, 2+ DP, SPN/DPN/TN intact. Skin:    General: Skin is warm and dry.  Neurological:     Mental Status: She is alert.  Psychiatric:        Mood and Affect: Mood normal.        Behavior: Behavior normal.    Assessment/Plan: Bilateral foot ulcers -- Will see what MRI says but will likely need further amputations. Dr. Sharol Given to evaluate later today.    Lisette Abu, PA-C Orthopedic Surgery [phone] 02/28/2021, 12:36 PM

## 2021-03-01 LAB — CBC
HCT: 33.4 % — ABNORMAL LOW (ref 36.0–46.0)
Hemoglobin: 11 g/dL — ABNORMAL LOW (ref 12.0–15.0)
MCH: 30.2 pg (ref 26.0–34.0)
MCHC: 32.9 g/dL (ref 30.0–36.0)
MCV: 91.8 fL (ref 80.0–100.0)
Platelets: 312 10*3/uL (ref 150–400)
RBC: 3.64 MIL/uL — ABNORMAL LOW (ref 3.87–5.11)
RDW: 11.3 % — ABNORMAL LOW (ref 11.5–15.5)
WBC: 12.3 10*3/uL — ABNORMAL HIGH (ref 4.0–10.5)
nRBC: 0 % (ref 0.0–0.2)

## 2021-03-01 LAB — SURGICAL PCR SCREEN
MRSA, PCR: NEGATIVE
Staphylococcus aureus: NEGATIVE

## 2021-03-01 LAB — GLUCOSE, CAPILLARY
Glucose-Capillary: 285 mg/dL — ABNORMAL HIGH (ref 70–99)
Glucose-Capillary: 183 mg/dL — ABNORMAL HIGH (ref 70–99)
Glucose-Capillary: 286 mg/dL — ABNORMAL HIGH (ref 70–99)

## 2021-03-01 LAB — BASIC METABOLIC PANEL
Anion gap: 7 (ref 5–15)
BUN: 6 mg/dL (ref 6–20)
CO2: 26 mmol/L (ref 22–32)
Calcium: 8.4 mg/dL — ABNORMAL LOW (ref 8.9–10.3)
Chloride: 99 mmol/L (ref 98–111)
Creatinine, Ser: 0.71 mg/dL (ref 0.44–1.00)
GFR, Estimated: >60 mL/min (ref >60)
Glucose, Bld: 336 mg/dL — ABNORMAL HIGH (ref 70–99)
Potassium: 4.1 mmol/L (ref 3.5–5.1)
Sodium: 132 mmol/L — ABNORMAL LOW (ref 135–145)

## 2021-03-01 MED ORDER — CHLORHEXIDINE GLUCONATE 4 % EX LIQD
60.0000 mL | Freq: Once | CUTANEOUS | Status: AC
  Administered 2021-03-02: 4 via TOPICAL
  Filled 2021-03-01: qty 15

## 2021-03-01 MED ORDER — INSULIN GLARGINE-YFGN 100 UNIT/ML ~~LOC~~ SOLN
30.0000 [IU] | Freq: Every day | SUBCUTANEOUS | Status: DC
Start: 2021-03-01 — End: 2021-03-03
  Administered 2021-03-01 – 2021-03-02 (×2): 30 [IU] via SUBCUTANEOUS
  Filled 2021-03-01 (×3): qty 0.3

## 2021-03-01 MED ORDER — CEFAZOLIN SODIUM-DEXTROSE 2-4 GM/100ML-% IV SOLN
2.0000 g | INTRAVENOUS | Status: DC
  Filled 2021-03-01 (×2): qty 100

## 2021-03-01 MED ORDER — POVIDONE-IODINE 10 % EX SWAB
2.0000 "application " | Freq: Once | CUTANEOUS | Status: AC
  Administered 2021-03-02: 2 via TOPICAL

## 2021-03-01 MED ORDER — SODIUM CHLORIDE 0.9 % IV SOLN
2.0000 g | INTRAVENOUS | Status: DC
  Administered 2021-03-01 – 2021-03-03 (×3): 2 g via INTRAVENOUS
  Filled 2021-03-01 (×3): qty 20

## 2021-03-01 MED ORDER — INSULIN ASPART 100 UNIT/ML IJ SOLN
5.0000 [IU] | Freq: Three times a day (TID) | INTRAMUSCULAR | Status: DC
Start: 2021-03-01 — End: 2021-03-03
  Administered 2021-03-01 – 2021-03-03 (×5): 5 [IU] via SUBCUTANEOUS

## 2021-03-01 NOTE — Progress Notes (Signed)
PROGRESS NOTE    Alison Gates  TIR:[id number] DOB: Sep 29, 1975 DOA: 02/27/2021 PCP: Pcp, No  Brief Narrative: 45/F with history of poorly controlled diabetes mellitus, prior history of osteomyelitis and right fifth toe amputation in 10/21, poor compliance with medications presented to the ED with pain and swelling and drainage involving left great toe and right fifth toe. -X-rays noted soft tissue swelling -Labs were notable for blood glucose of 501 without DKA, ESR and CRP significantly elevated -MRI concerning for osteomyelitis, orthopedics consulted  Assessment & Plan:   Sepsis, poa Osteomyelitis -Involving right fourth toe and left great toe -Prior fifth toe amputation -Orthopedics consulted, ESR and CRP significantly elevated -Continue vancomycin and ceftriaxone -MRI confirms osteomyelitis of above 2 toes, orthopedics consulting, plan for toe amputation Friday or Saturday, discussed this in detail with patient using interpreter  Type 2 diabetes mellitus -Recent hemoglobin A1c was 12.5 and June -Poorly compliant with meds -Increase Semglee and meal coverage, hemoglobin A1c is 10.5 osteomyelitis  DVT prophylaxis: Lovenox Code Status: Full code Family Communication: Discussed patient in detail, no family at bedside Disposition Plan:  Status is: Inpatient  Remains inpatient appropriate because: Severity of illness  Consultants:  Orthopedics Dr. Sharol Given  Procedures:   Antimicrobials:    Subjective: -Feels okay, going to have surgery Friday or Saturday  Objective: Vitals:   02/28/21 1613 02/28/21 2043 03/01/21 0449 03/01/21 0740  BP: 118/74 122/73 101/66 122/75  Pulse: 94 99 95 93  Resp: 16 18 17 16   Temp: 98.3 F (36.8 C) 99.9 F (37.7 C) 99.3 F (37.4 C) 98.1 F (36.7 C)  TempSrc: Oral Oral Oral Oral  SpO2: 99% 98% 99% 99%  Weight:      Height:        Intake/Output Summary (Last 24 hours) at 03/01/2021 1121 Last data filed at 03/01/2021 0736 Gross per 24  hour  Intake 600.76 ml  Output --  Net 600.76 ml   Filed Weights   02/27/21 1608  Weight: 77.1 kg    Examination:  Gen: Awake, Alert, Oriented X 3,  HEENT: no JVD Lungs: Good air movement bilaterally, CTAB CVS: S1S2/RRR Abd: soft, Non tender, non distended, BS present Extremities: Swelling erythema and tenderness involving left great toe and right fifth toe, bounding dorsalis pedis pulses Skin: As above Psychiatry: Judgement and insight appear normal. Mood & affect appropriate.   Data Reviewed:   CBC: Recent Labs  Lab 02/27/21 1610 02/28/21 0500 03/01/21 0110  WBC 15.2* 12.5* 12.3*  NEUTROABS 12.8*  --   --   HGB 12.7 11.7* 11.0*  HCT 37.5 34.7* 33.4*  MCV 90.8 91.1 91.8  PLT 324 294 676   Basic Metabolic Panel: Recent Labs  Lab 02/27/21 1610 03/01/21 0110  NA 130* 132*  K 4.6 4.1  CL 94* 99  CO2 27 26  GLUCOSE 501* 336*  BUN 10 6  CREATININE 0.69 0.71  CALCIUM 9.2 8.4*   GFR: Estimated Creatinine Clearance: 86.3 mL/min (by C-G formula based on SCr of 0.71 mg/dL). Liver Function Tests: No results for input(s): AST, ALT, ALKPHOS, BILITOT, PROT, ALBUMIN in the last 168 hours. No results for input(s): LIPASE, AMYLASE in the last 168 hours. No results for input(s): AMMONIA in the last 168 hours. Coagulation Profile: No results for input(s): INR, PROTIME in the last 168 hours. Cardiac Enzymes: No results for input(s): CKTOTAL, CKMB, CKMBINDEX, TROPONINI in the last 168 hours. BNP (last 3 results) No results for input(s): PROBNP in the last 8760 hours. HbA1C: Recent Labs  02/27/21 2027  HGBA1C 10.5*   CBG: Recent Labs  Lab 02/27/21 2115 02/27/21 2128 02/28/21 1213 02/28/21 1656 03/01/21 0741  GLUCAP 232* 236* 240* 268* 285*   Lipid Profile: Recent Labs    02/28/21 0500  CHOL 127  HDL 38*  LDLCALC 71  TRIG 91  CHOLHDL 3.3   Thyroid Function Tests: No results for input(s): TSH, T4TOTAL, FREET4, T3FREE, THYROIDAB in the last 72  hours. Anemia Panel: No results for input(s): VITAMINB12, FOLATE, FERRITIN, TIBC, IRON, RETICCTPCT in the last 72 hours. Urine analysis: No results found for: COLORURINE, APPEARANCEUR, LABSPEC, PHURINE, GLUCOSEU, HGBUR, BILIRUBINUR, KETONESUR, PROTEINUR, UROBILINOGEN, NITRITE, LEUKOCYTESUR Sepsis Labs: @LABRCNTIP (procalcitonin:4,lacticidven:4)  ) Recent Results (from the past 240 hour(s))  Blood culture (routine x 2)     Status: None (Preliminary result)   Collection Time: 02/27/21  5:26 PM   Specimen: BLOOD RIGHT WRIST  Result Value Ref Range Status   Specimen Description BLOOD RIGHT WRIST  Final   Special Requests   Final    BOTTLES DRAWN AEROBIC AND ANAEROBIC Blood Culture results may not be optimal due to an inadequate volume of blood received in culture bottles   Culture   Final    NO GROWTH < 24 HOURS Performed at [facility], [place] [address], [place], [place] [address]    Report Status PENDING  Incomplete  Blood culture (routine x 2)     Status: None (Preliminary result)   Collection Time: 02/27/21  5:54 PM   Specimen: Site Not Specified; Blood  Result Value Ref Range Status   Specimen Description SITE NOT SPECIFIED  Final   Special Requests   Final    BOTTLES DRAWN AEROBIC AND ANAEROBIC Blood Culture results may not be optimal due to an excessive volume of blood received in culture bottles   Culture   Final    NO GROWTH < 24 HOURS Performed at [facility] [address], [place], [place] [address]    Report Status PENDING  Incomplete  Resp Panel by RT-PCR (Flu A&B, Covid) Nasopharyngeal Swab     Status: None   Collection Time: 02/28/21  1:14 AM   Specimen: Nasopharyngeal Swab; Nasopharyngeal(NP) swabs in vial transport medium  Result Value Ref Range Status   SARS Coronavirus 2 by RT PCR NEGATIVE NEGATIVE Final    Comment: (NOTE) SARS-CoV-2 target nucleic acids are NOT DETECTED.  The SARS-CoV-2 RNA is generally detectable in upper respiratory specimens  during the acute phase of infection. The lowest concentration of SARS-CoV-2 viral copies this assay can detect is 138 copies/mL. A negative result does not preclude SARS-Cov-2 infection and should not be used as the sole basis for treatment or other patient management decisions. A negative result may occur with  improper specimen collection/handling, submission of specimen other than nasopharyngeal swab, presence of viral mutation(s) within the areas targeted by this assay, and inadequate number of viral copies(<138 copies/mL). A negative result must be combined with clinical observations, patient history, and epidemiological information. The expected result is Negative.  Fact Sheet for Patients:  EntrepreneurPulse.com.au  Fact Sheet for Healthcare Providers:  IncredibleEmployment.be  This test is no t yet approved or cleared by the [place] FDA and  has been authorized for detection and/or diagnosis of SARS-CoV-2 by FDA under an Emergency Use Authorization (EUA). This EUA will remain  in effect (meaning this test can be used) for the duration of the COVID-19 declaration under Section 564(b)(1) of the Act, 21 U.S.C.section 360bbb-3(b)(1), unless the authorization  is terminated  or revoked sooner.       Influenza A by PCR NEGATIVE NEGATIVE Final   Influenza B by PCR NEGATIVE NEGATIVE Final    Comment: (NOTE) The Xpert Xpress SARS-CoV-2/FLU/RSV plus assay is intended as an aid in the diagnosis of influenza from Nasopharyngeal swab specimens and should not be used as a sole basis for treatment. Nasal washings and aspirates are unacceptable for Xpert Xpress SARS-CoV-2/FLU/RSV testing.  Fact Sheet for Patients: EntrepreneurPulse.com.au  Fact Sheet for Healthcare Providers: IncredibleEmployment.be  This test is not yet approved or cleared by the [place] FDA and has been authorized for detection  and/or diagnosis of SARS-CoV-2 by FDA under an Emergency Use Authorization (EUA). This EUA will remain in effect (meaning this test can be used) for the duration of the COVID-19 declaration under Section 564(b)(1) of the Act, 21 U.S.C. section 360bbb-3(b)(1), unless the authorization is terminated or revoked.  Performed at [facility], [place] [address] [place], [place] [address]          Radiology Studies: MR FOOT RIGHT W WO CONTRAST  Result Date: 02/28/2021 CLINICAL DATA:  Pain and swelling of the right fourth toe. History of prior amputation of the fifth toe. EXAM: MRI OF THE RIGHT FOREFOOT WITHOUT AND WITH CONTRAST TECHNIQUE: Multiplanar, multisequence MR imaging of the right foot was performed before and after the administration of intravenous contrast. CONTRAST:  9m GADAVIST GADOBUTROL 1 MMOL/ML IV SOLN COMPARISON:  MRI 01/25/2020 FINDINGS: Severe subcutaneous soft tissue swelling/edema and fluid involving the fourth toe. There is an open wound along the dorsum of the toe and gas in the soft tissues. Abnormal T1 and T2 signal intensity and advanced erosive changes involving the middle and distal phalanges consistent with osteomyelitis. Probable septic arthritis at the distal interphalangeal joint. After contrast administration there are areas of nonenhancing soft tissue which could reflect necrotic tissue. Prior amputation of the fifth toe and most of the fifth metatarsal. Moderate forefoot and midfoot degenerative changes but I do not see any other sites worrisome for septic arthritis or osteomyelitis. Mild diffuse subcutaneous soft tissue swelling/edema mainly along the dorsum of the foot consistent with cellulitis. No findings for myofasciitis or pyomyositis. IMPRESSION: 1. Osteomyelitis involving the middle and distal phalanges of the fourth toe as detailed above. 2. Cellulitis without discrete drainable soft tissue abscess. 3. No findings for myofasciitis or pyomyositis.  Electronically Signed   By: PMarijo SanesM.D.   On: 02/28/2021 08:07   MR FOOT LEFT W WO CONTRAST  Result Date: 02/28/2021 CLINICAL DATA:  Pain and swelling of the great toe. EXAM: MRI OF THE LEFT FOREFOOT WITHOUT AND WITH CONTRAST TECHNIQUE: Multiplanar, multisequence MR imaging of the left foot was performed both before and after administration of intravenous contrast. CONTRAST:  721mGADAVIST GADOBUTROL 1 MMOL/ML IV SOLN COMPARISON:  Radiographs 02/27/2021 FINDINGS: There is marked soft tissue swelling/edema/fluid surrounding the great toe. I do not see an obvious open wound or gas in the soft tissues. Curvilinear fluid collection along the medial and plantar aspect of the great toe consistent with an abscess measuring a maximum of 2.4 cm. Abnormal T1 and T2 signal intensity and subsequent enhancement in the distal phalanx consistent with osteomyelitis. There is a small joint effusion at the interphalangeal joint but I do not see any definite findings for septic arthritis. The other bony structures are intact. No other sites of osteomyelitis are identified. No findings for myofasciitis or pyomyositis. IMPRESSION: 1. 2.4 cm abscess along the medial  and plantar aspect of the great toe with significant surrounding cellulitis. 2. Osteomyelitis involving the distal phalanx of the great toe. 3. Small joint effusion at the interphalangeal joint of the great toe but I do not see any definite findings for septic arthritis. 4. No findings for myofasciitis or pyomyositis. Electronically Signed   By: Marijo Sanes M.D.   On: 02/28/2021 08:13   DG Chest Port 1 View  Result Date: 02/28/2021 CLINICAL DATA:  Cough. EXAM: PORTABLE CHEST 1 VIEW COMPARISON:  None. FINDINGS: The heart size and mediastinal contours are within normal limits. Both lungs are clear. The visualized skeletal structures are unremarkable. IMPRESSION: No active disease. Electronically Signed   By: Anner Crete M.D.   On: 02/28/2021 01:34   DG  Foot 2 Views Right  Result Date: 02/27/2021 CLINICAL DATA:  fourth toe swelling and injury. evaluate for osteomyelitis EXAM: RIGHT FOOT - 2 VIEW COMPARISON:  X-ray right foot 01/24/2020 FINDINGS: Right fifth metatarsal amputation-Clear margins, no definite cortical erosion or destruction. Subcutaneus soft tissue edema and emphysema along the distal fourth digit with query trace cortical erosion of the phalangeal tuft-limited evaluation with no oblique view obtained. There is no evidence of fracture or dislocation. No severe degenerative changes. IMPRESSION: Subcutaneus soft tissue edema and emphysema along the distal fourth digit with query trace cortical erosion of the phalangeal tuft-limited evaluation with no oblique view obtained. Consider further evaluation with an oblique view of the foot versus MRI for further evaluation (with intravenous contrast if GFR greater than 30). Electronically Signed   By: Iven Finn M.D.   On: 02/27/2021 18:20   DG Foot Complete Left  Result Date: 02/27/2021 CLINICAL DATA:  45 year old female with a great toe wound EXAM: LEFT FOOT - COMPLETE 3+ VIEW COMPARISON:  None. FINDINGS: No acute displaced fracture.  No erosive changes of the first digit. No subluxation/dislocation. Soft tissue swelling of the great toe.  No subcutaneous gas. No radiopaque foreign body. Medial calcinosis of the tibial arteries. IMPRESSION: Soft tissue swelling of the great toe compatible with cellulitis, with no evidence of osteomyelitis on the plain film. Tibial artery atherosclerosis Electronically Signed   By: Corrie Mckusick D.O.   On: 02/27/2021 16:33    Scheduled Meds:  enoxaparin (LOVENOX) injection  40 mg Subcutaneous Q24H   insulin aspart  0-15 Units Subcutaneous TID WC   insulin aspart  5 Units Subcutaneous TID WC   insulin glargine-yfgn  30 Units Subcutaneous QHS   insulin starter kit- pen needles  1 kit Other Once   living well with diabetes book- in spanish   Does not apply  Once   nutrition supplement (JUVEN)  1 packet Oral BID BM   Continuous Infusions:  cefTRIAXone (ROCEPHIN)  IV Stopped (03/01/21 1037)   vancomycin 1,250 mg (03/01/21 1038)     LOS: 2 days    Time spent: 59mn  PDomenic Polite MD [facility]   03/01/2021, 11:21 AM

## 2021-03-01 NOTE — Progress Notes (Signed)
PHARMACY NOTE:  ANTIMICROBIAL DOSAGE ADJUSTMENT  Current antimicrobial regimen includes a mismatch between antimicrobial dosage and estimated renal function.  As per policy approved by the Pharmacy & Therapeutics and Medical Executive Committees, the antimicrobial dosage will be adjusted accordingly.  Current antimicrobial dosage:  Rocephin 1gm IV Q24H  Indication: LE cellulitis with osteomyelitis on MRI    Antimicrobial dosage has been changed to:  Rocephin 2gm IV Q24H   Lova Urbieta D. Laney Potash, PharmD, BCPS, BCCCP 03/01/2021, 8:35 AM

## 2021-03-02 ENCOUNTER — Inpatient Hospital Stay (HOSPITAL_COMMUNITY): Payer: 59 | Admitting: Certified Registered Nurse Anesthetist

## 2021-03-02 ENCOUNTER — Encounter (HOSPITAL_COMMUNITY): Payer: Self-pay | Admitting: Internal Medicine

## 2021-03-02 ENCOUNTER — Encounter (HOSPITAL_COMMUNITY)
Admission: EM | Disposition: A | Discharge status: Home / Self Care | Payer: Self-pay | Source: Home / Self Care | Attending: Internal Medicine

## 2021-03-02 DIAGNOSIS — L0390 Cellulitis, unspecified: Secondary | ICD-10-CM

## 2021-03-02 HISTORY — PX: AMPUTATION TOE: SHX6595

## 2021-03-02 LAB — GLUCOSE, CAPILLARY
Glucose-Capillary: 337 mg/dL — ABNORMAL HIGH (ref 70–99)
Glucose-Capillary: 212 mg/dL — ABNORMAL HIGH (ref 70–99)
Glucose-Capillary: 262 mg/dL — ABNORMAL HIGH (ref 70–99)
Glucose-Capillary: 185 mg/dL — ABNORMAL HIGH (ref 70–99)
Glucose-Capillary: 169 mg/dL — ABNORMAL HIGH (ref 70–99)
Glucose-Capillary: 263 mg/dL — ABNORMAL HIGH (ref 70–99)

## 2021-03-02 LAB — VANCOMYCIN, RANDOM: Vancomycin Rm: 8

## 2021-03-02 SURGERY — AMPUTATION, TOE
Anesthesia: Monitor Anesthesia Care | Site: Toe

## 2021-03-02 MED ORDER — MAGNESIUM SULFATE 2 GM/50ML IV SOLN
2.0000 g | Freq: Every day | INTRAVENOUS | Status: DC | PRN
  Filled 2021-03-02: qty 50

## 2021-03-02 MED ORDER — PROPOFOL 500 MG/50ML IV EMUL
INTRAVENOUS | Status: DC | PRN
  Administered 2021-03-02: 125 ug/kg/min via INTRAVENOUS

## 2021-03-02 MED ORDER — VANCOMYCIN VARIABLE DOSE PER UNSTABLE RENAL FUNCTION (PHARMACIST DOSING): Status: DC

## 2021-03-02 MED ORDER — LABETALOL HCL 5 MG/ML IV SOLN: 10.0000 mg | INTRAVENOUS | Status: DC | PRN

## 2021-03-02 MED ORDER — HYDROMORPHONE HCL 1 MG/ML IJ SOLN: 0.5000 mg | INTRAMUSCULAR | Status: DC | PRN

## 2021-03-02 MED ORDER — DOCUSATE SODIUM 100 MG PO CAPS
100.0000 mg | ORAL_CAPSULE | Freq: Every day | ORAL | Status: DC
  Administered 2021-03-03 – 2021-03-04 (×2): 100 mg via ORAL
  Filled 2021-03-02 (×2): qty 1

## 2021-03-02 MED ORDER — ACETAMINOPHEN 325 MG PO TABS: 325.0000 mg | ORAL_TABLET | Freq: Four times a day (QID) | ORAL | Status: DC | PRN

## 2021-03-02 MED ORDER — CHLORHEXIDINE GLUCONATE 0.12 % MT SOLN
OROMUCOSAL | Status: AC
  Administered 2021-03-02: 15 mL via OROMUCOSAL
  Filled 2021-03-02: qty 15

## 2021-03-02 MED ORDER — FENTANYL CITRATE (PF) 100 MCG/2ML IJ SOLN
INTRAMUSCULAR | Status: AC
  Filled 2021-03-02: qty 2

## 2021-03-02 MED ORDER — MIDAZOLAM HCL 5 MG/5ML IJ SOLN
INTRAMUSCULAR | Status: DC | PRN
  Administered 2021-03-02: 1 mg via INTRAVENOUS

## 2021-03-02 MED ORDER — DEXAMETHASONE SODIUM PHOSPHATE 10 MG/ML IJ SOLN
INTRAMUSCULAR | Status: AC
  Filled 2021-03-02: qty 1

## 2021-03-02 MED ORDER — PANTOPRAZOLE SODIUM 40 MG PO TBEC
40.0000 mg | DELAYED_RELEASE_TABLET | Freq: Every day | ORAL | Status: DC
  Administered 2021-03-03 – 2021-03-04 (×2): 40 mg via ORAL
  Filled 2021-03-02 (×2): qty 1

## 2021-03-02 MED ORDER — MIDAZOLAM HCL 2 MG/2ML IJ SOLN
INTRAMUSCULAR | Status: AC
  Filled 2021-03-02: qty 2

## 2021-03-02 MED ORDER — OXYCODONE HCL 5 MG PO TABS
5.0000 mg | ORAL_TABLET | ORAL | Status: DC | PRN
  Administered 2021-03-02: 5 mg via ORAL
  Filled 2021-03-02: qty 2
  Filled 2021-03-02: qty 1

## 2021-03-02 MED ORDER — POTASSIUM CHLORIDE CRYS ER 20 MEQ PO TBCR: 20.0000 meq | EXTENDED_RELEASE_TABLET | Freq: Every day | ORAL | Status: DC | PRN

## 2021-03-02 MED ORDER — ZINC SULFATE 220 (50 ZN) MG PO CAPS
220.0000 mg | ORAL_CAPSULE | Freq: Every day | ORAL | Status: DC
  Administered 2021-03-02 – 2021-03-04 (×3): 220 mg via ORAL
  Filled 2021-03-02 (×3): qty 1

## 2021-03-02 MED ORDER — ALUM & MAG HYDROXIDE-SIMETH 200-200-20 MG/5ML PO SUSP: 15.0000 mL | ORAL | Status: DC | PRN

## 2021-03-02 MED ORDER — SODIUM CHLORIDE 0.9 % IV SOLN: INTRAVENOUS | Status: DC

## 2021-03-02 MED ORDER — ORAL CARE MOUTH RINSE: 15.0000 mL | Freq: Once | OROMUCOSAL | Status: AC

## 2021-03-02 MED ORDER — SUCCINYLCHOLINE CHLORIDE 200 MG/10ML IV SOSY
PREFILLED_SYRINGE | INTRAVENOUS | Status: AC
  Filled 2021-03-02: qty 20

## 2021-03-02 MED ORDER — PROPOFOL 10 MG/ML IV BOLUS
INTRAVENOUS | Status: DC | PRN
  Administered 2021-03-02: 10 mg via INTRAVENOUS

## 2021-03-02 MED ORDER — LIDOCAINE HCL (PF) 1 % IJ SOLN
INTRAMUSCULAR | Status: AC
  Filled 2021-03-02: qty 30

## 2021-03-02 MED ORDER — INSULIN ASPART 100 UNIT/ML IJ SOLN
6.0000 [IU] | Freq: Once | INTRAMUSCULAR | Status: AC
Start: 2021-03-02 — End: 2021-03-02
  Administered 2021-03-02: 6 [IU] via SUBCUTANEOUS
  Filled 2021-03-02: qty 0.06

## 2021-03-02 MED ORDER — POLYETHYLENE GLYCOL 3350 17 G PO PACK: 17.0000 g | PACK | Freq: Every day | ORAL | Status: DC | PRN

## 2021-03-02 MED ORDER — METOPROLOL TARTRATE 5 MG/5ML IV SOLN: 2.0000 mg | INTRAVENOUS | Status: DC | PRN

## 2021-03-02 MED ORDER — 0.9 % SODIUM CHLORIDE (POUR BTL) OPTIME
TOPICAL | Status: DC | PRN
  Administered 2021-03-02: 1000 mL

## 2021-03-02 MED ORDER — SODIUM CHLORIDE 0.9 % IV SOLN: INTRAVENOUS | Status: DC | PRN

## 2021-03-02 MED ORDER — LACTATED RINGERS IV SOLN: INTRAVENOUS | Status: DC

## 2021-03-02 MED ORDER — BISACODYL 5 MG PO TBEC: 5.0000 mg | DELAYED_RELEASE_TABLET | Freq: Every day | ORAL | Status: DC | PRN

## 2021-03-02 MED ORDER — OXYCODONE HCL 5 MG PO TABS
10.0000 mg | ORAL_TABLET | ORAL | Status: DC | PRN
  Administered 2021-03-03: 10 mg via ORAL

## 2021-03-02 MED ORDER — FENTANYL CITRATE (PF) 250 MCG/5ML IJ SOLN
INTRAMUSCULAR | Status: AC
  Filled 2021-03-02: qty 5

## 2021-03-02 MED ORDER — HYDRALAZINE HCL 20 MG/ML IJ SOLN: 5.0000 mg | INTRAMUSCULAR | Status: DC | PRN

## 2021-03-02 MED ORDER — PHENOL 1.4 % MT LIQD: 1.0000 | OROMUCOSAL | Status: DC | PRN

## 2021-03-02 MED ORDER — GUAIFENESIN-DM 100-10 MG/5ML PO SYRP: 15.0000 mL | ORAL_SOLUTION | ORAL | Status: DC | PRN

## 2021-03-02 MED ORDER — JUVEN PO PACK
1.0000 | PACK | Freq: Two times a day (BID) | ORAL | Status: DC
  Administered 2021-03-02 – 2021-03-04 (×5): 1 via ORAL
  Filled 2021-03-02 (×4): qty 1

## 2021-03-02 MED ORDER — CHLORHEXIDINE GLUCONATE 0.12 % MT SOLN: 15.0000 mL | Freq: Once | OROMUCOSAL | Status: AC

## 2021-03-02 MED ORDER — FENTANYL CITRATE (PF) 100 MCG/2ML IJ SOLN: 25.0000 ug | INTRAMUSCULAR | Status: DC | PRN

## 2021-03-02 MED ORDER — ONDANSETRON HCL 4 MG/2ML IJ SOLN: 4.0000 mg | Freq: Four times a day (QID) | INTRAMUSCULAR | Status: DC | PRN

## 2021-03-02 MED ORDER — LIDOCAINE 2% (20 MG/ML) 5 ML SYRINGE
INTRAMUSCULAR | Status: AC
  Filled 2021-03-02: qty 5

## 2021-03-02 MED ORDER — ONDANSETRON HCL 4 MG/2ML IJ SOLN
INTRAMUSCULAR | Status: AC
  Filled 2021-03-02: qty 2

## 2021-03-02 MED ORDER — VANCOMYCIN HCL 1250 MG/250ML IV SOLN
1250.0000 mg | Freq: Two times a day (BID) | INTRAVENOUS | Status: DC
  Administered 2021-03-03: 1250 mg via INTRAVENOUS
  Filled 2021-03-02 (×2): qty 250

## 2021-03-02 MED ORDER — ONDANSETRON HCL 4 MG/2ML IJ SOLN
INTRAMUSCULAR | Status: DC | PRN
  Administered 2021-03-02: 4 mg via INTRAVENOUS

## 2021-03-02 MED ORDER — LIDOCAINE HCL (PF) 1 % IJ SOLN
INTRAMUSCULAR | Status: DC | PRN
  Administered 2021-03-02: 20 mL

## 2021-03-02 MED ORDER — ASCORBIC ACID 500 MG PO TABS
1000.0000 mg | ORAL_TABLET | Freq: Every day | ORAL | Status: DC
  Administered 2021-03-02 – 2021-03-04 (×3): 1000 mg via ORAL
  Filled 2021-03-02 (×3): qty 2

## 2021-03-02 MED ORDER — PHENYLEPHRINE 40 MCG/ML (10ML) SYRINGE FOR IV PUSH (FOR BLOOD PRESSURE SUPPORT)
PREFILLED_SYRINGE | INTRAVENOUS | Status: AC
  Filled 2021-03-02: qty 10

## 2021-03-02 MED ORDER — MAGNESIUM CITRATE PO SOLN
1.0000 | Freq: Once | ORAL | Status: DC | PRN
  Filled 2021-03-02: qty 296

## 2021-03-02 SURGICAL SUPPLY — 30 items
BAG COUNTER SPONGE SURGICOUNT (BAG) ×2 IMPLANT
BLADE SAW SGTL MED 73X18.5 STR (BLADE) IMPLANT
BLADE SURG 21 STRL SS (BLADE) ×2 IMPLANT
BNDG COHESIVE 1X5 TAN STRL LF (GAUZE/BANDAGES/DRESSINGS) ×2 IMPLANT
BNDG COHESIVE 4X5 TAN STRL (GAUZE/BANDAGES/DRESSINGS) ×2 IMPLANT
BNDG CONFORM 3 STRL LF (GAUZE/BANDAGES/DRESSINGS) ×2 IMPLANT
BNDG GAUZE ELAST 4 BULKY (GAUZE/BANDAGES/DRESSINGS) ×2 IMPLANT
COVER SURGICAL LIGHT HANDLE (MISCELLANEOUS) ×3 IMPLANT
DRAPE BILATERAL LIMB T (DRAPES) ×1 IMPLANT
DRAPE U-SHAPE 47X51 STRL (DRAPES) ×4 IMPLANT
DRSG ADAPTIC 3X8 NADH LF (GAUZE/BANDAGES/DRESSINGS) ×2 IMPLANT
DRSG PAD ABDOMINAL 8X10 ST (GAUZE/BANDAGES/DRESSINGS) ×2 IMPLANT
DURAPREP 26ML APPLICATOR (WOUND CARE) ×3 IMPLANT
ELECT REM PT RETURN 9FT ADLT (ELECTROSURGICAL) ×2
ELECTRODE REM PT RTRN 9FT ADLT (ELECTROSURGICAL) ×1 IMPLANT
GAUZE SPONGE 4X4 12PLY STRL (GAUZE/BANDAGES/DRESSINGS) ×3 IMPLANT
GLOVE SURG ORTHO LTX SZ9 (GLOVE) ×2 IMPLANT
GLOVE SURG UNDER POLY LF SZ9 (GLOVE) ×2 IMPLANT
GOWN STRL REUS W/ TWL XL LVL3 (GOWN DISPOSABLE) ×2 IMPLANT
GOWN STRL REUS W/TWL XL LVL3 (GOWN DISPOSABLE) ×2
KIT BASIN OR (CUSTOM PROCEDURE TRAY) ×2 IMPLANT
KIT TURNOVER KIT B (KITS) ×2 IMPLANT
NS IRRIG 1000ML POUR BTL (IV SOLUTION) ×2 IMPLANT
PACK ORTHO EXTREMITY (CUSTOM PROCEDURE TRAY) ×2 IMPLANT
PAD ARMBOARD 7.5X6 YLW CONV (MISCELLANEOUS) ×4 IMPLANT
STOCKINETTE IMPERVIOUS LG (DRAPES) ×1 IMPLANT
SUT ETHILON 2 0 PSLX (SUTURE) ×3 IMPLANT
TOWEL GREEN STERILE (TOWEL DISPOSABLE) ×2 IMPLANT
TUBE CONNECTING 12X1/4 (SUCTIONS) ×2 IMPLANT
YANKAUER SUCT BULB TIP NO VENT (SUCTIONS) ×2 IMPLANT

## 2021-03-02 NOTE — Progress Notes (Signed)
Orthopedic Tech Progress Note Patient Details:  Alison Gates [birth year] [id number]  Patient ID: Stana Bunting, female   DOB: [birth year], 45 y.o.   MRN: [id number] Dropped post op shoe off in patients room. Artis Flock Yue Glasheen 03/02/2021, 5:06 PM

## 2021-03-02 NOTE — Anesthesia Preprocedure Evaluation (Signed)
Anesthesia Evaluation  Patient identified by MRN, date of birth, ID band Patient awake    Reviewed: Allergy & Precautions, NPO status , Patient's Chart, lab work & pertinent test results  Airway Mallampati: II  TM Distance: >3 FB Neck ROM: Full    Dental no notable dental hx.    Pulmonary neg pulmonary ROS,    Pulmonary exam normal breath sounds clear to auscultation       Cardiovascular Exercise Tolerance: Good + Peripheral Vascular Disease  Normal cardiovascular exam Rhythm:Regular Rate:Normal     Neuro/Psych negative neurological ROS     GI/Hepatic negative GI ROS, Neg liver ROS,   Endo/Other  diabetes, Poorly Controlled, Type 2Obesity BMI: 34  Renal/GU negative Renal ROS     Musculoskeletal negative musculoskeletal ROS (+)   Abdominal Normal abdominal exam  (+)   Peds negative pediatric ROS (+)  Hematology  (+) anemia ,   Anesthesia Other Findings   Reproductive/Obstetrics                             Anesthesia Physical  Anesthesia Plan  ASA: 3  Anesthesia Plan: MAC   Post-op Pain Management: Minimal or no pain anticipated   Induction:   PONV Risk Score and Plan: 2 and Propofol infusion, Treatment may vary due to age or medical condition, Midazolam and Ondansetron  Airway Management Planned: Natural Airway and Simple Face Mask  Additional Equipment:   Intra-op Plan:   Post-operative Plan:   Informed Consent: I have reviewed the patients History and Physical, chart, labs and discussed the procedure including the risks, benefits and alternatives for the proposed anesthesia with the patient or authorized representative who has indicated his/her understanding and acceptance.     Dental advisory given and Interpreter used for interveiw  Plan Discussed with: CRNA and Anesthesiologist  Anesthesia Plan Comments: (MAC with Propofol gtt. Local by surgeon. GA/LMA as backup  plan.)        Anesthesia Quick Evaluation

## 2021-03-02 NOTE — Anesthesia Procedure Notes (Signed)
Procedure Name: MAC Date/Time: 03/02/2021 11:14 AM Performed by: Janene Harvey, CRNA Pre-anesthesia Checklist: Patient identified, Emergency Drugs available, Suction available and Patient being monitored Patient Re-evaluated:Patient Re-evaluated prior to induction Oxygen Delivery Method: Simple face mask Induction Type: IV induction Placement Confirmation: positive ETCO2 Dental Injury: Teeth and Oropharynx as per pre-operative assessment

## 2021-03-02 NOTE — Progress Notes (Signed)
Pharmacy Antibiotic Note  Alison Gates is a 45 y.o. female admitted on 02/27/2021 with toe pain and swelling.  Pharmacy has been consulted for vancomycin dosing for cellulitis and osteomyelitis.  Patient is also on Rocephin.  S/p amputation 11/25 with plan to continue antibiotics for another 24 hours.  Renal function stable and vancomycin random level is low at 8 mcg/mL, indicating that current dose might be appropriate.   Plan: Continue vanc 1250mg  IV Q12H  Rocephin 2gm IV Q24H F/U with order to discontinue antibiotics tomorrow 11/26  Height: 5\' 2"  (157.5 cm) Weight: 77.1 kg (170 lb) IBW/kg (Calculated) : 50.1  Temp (24hrs), Avg:98.1 F (36.7 C), Min:97 F (36.1 C), Max:98.8 F (37.1 C)  Recent Labs  Lab 02/27/21 1610 02/27/21 1703 02/27/21 2027 02/28/21 0500 03/01/21 0110  WBC 15.2*  --   --  12.5* 12.3*  CREATININE 0.69  --   --   --  0.71  LATICACIDVEN  --  2.1* 1.1  --   --     Estimated Creatinine Clearance: 86.3 mL/min (by C-G formula based on SCr of 0.71 mg/dL).    No Known Allergies  Vanc 11/22 >> CTX 11/22 >>    11/24 MRSA - negative 11/23 Flu A/B/COVID - negative 11/22 BCx - NGTD   Montoya Brandel D. 12/23, PharmD, BCPS, BCCCP 03/02/2021, 10:32 PM

## 2021-03-02 NOTE — Op Note (Signed)
03/02/2021  11:56 AM  PATIENT:  Alison Gates    PRE-OPERATIVE DIAGNOSIS:  OSTEOMYELITIS OF LEFT GREAT TOE AND RIGHT FOURTH TOE  POST-OPERATIVE DIAGNOSIS:  Same  PROCEDURE:  AMPUTATION OF LEFT GREAT TOE AND RIGHT FOURTH TOE  SURGEON:  Newt Minion, MD  PHYSICIAN ASSISTANT:None ANESTHESIA:   General  PREOPERATIVE INDICATIONS:  Alison Gates is a  45 y.o. female with a diagnosis of OSTEOMYELITIS OF LEFT GREAT TOE AND RIGHT FOURTH TOE who failed conservative measures and elected for surgical management.    The risks benefits and alternatives were discussed with the patient preoperatively including but not limited to the risks of infection, bleeding, nerve injury, cardiopulmonary complications, the need for revision surgery, among others, and the patient was willing to proceed.  OPERATIVE IMPLANTS: None  _0 @  OPERATIVE FINDINGS: Tissue margins clear however patient did have a abscess both the right fourth toe and left great toe  OPERATIVE PROCEDURE: Patient brought the operating room and underwent a MAC anesthetic.  After adequate levels anesthesia were obtained both lower extremities were prepped using DuraPrep draped into a sterile field a timeout was called.  10 cc of local anesthesia was used for digital block for the left great toe and 10 cc was used for digital block of the right fourth toe.  Attention was first focused on the left great toe.  A fishmouth incision was made just distal to the MTP joint.  Amputation was carried down and the sesamoids were resected as well.  The FHL was pulled cut and allowed to retract.  The soft tissue was resected back to healthy viable margins the wound was irrigated normal saline electrocardio was used hemostasis incision closed using 2-0 nylon.  Attention was then focused on the right fourth toe.  A racquet incision was made around the fourth toe the resection was carried down through the MTP joint and the distal metatarsal head was also  resected.  Soft tissue margins were clear irrigated with normal saline electrocardio was used for hemostasis and the incision closed using 2-0 nylon.  Sterile dressing was applied bilaterally patient was taken the PACU in stable condition.   DISCHARGE PLANNING:  Antibiotic duration: Continue antibiotics for 24 hours  Weightbearing: Weightbearing as tolerated bilaterally however patient should minimize ambulation to decrease the stress to the surgical incisions  Pain medication: Opioid pathway  Dressing care/ Wound VAC: Dry dressing change as needed  Ambulatory devices: Walker or crutches  Discharge to: Anticipate discharge to home.  Follow-up: In the office 1 week post operative.

## 2021-03-02 NOTE — Transfer of Care (Signed)
Immediate Anesthesia Transfer of Care Note  Patient: Alison Gates  Procedure(s) Performed: AMPUTATION OF LEFT GREAT TOE AND RIGHT FOURTH TOE (Toe)  Patient Location: PACU  Anesthesia Type:General  Level of Consciousness: drowsy and patient cooperative  Airway & Oxygen Therapy: Patient Spontanous Breathing  Post-op Assessment: Report given to RN and Post -op Vital signs reviewed and stable  Post vital signs: Reviewed and stable  Last Vitals:  Vitals Value Taken Time  BP 87/60 03/02/21 1151  Temp 36.1 C 03/02/21 1151  Pulse 90 03/02/21 1157  Resp 20 03/02/21 1157  SpO2 98 % 03/02/21 1157  Vitals shown include unvalidated device data.  Last Pain:  Vitals:   03/02/21 1151  TempSrc:   PainSc: Asleep         Complications: No notable events documented.

## 2021-03-02 NOTE — Interval H&P Note (Signed)
History and Physical Interval Note:  03/02/2021 7:43 AM  Alison Gates  has presented today for surgery, with the diagnosis of OSTEOMYELITIS OF LEFT GREAT TOE AND RIGHT FOURTH TOE.  The various methods of treatment have been discussed with the patient and family. After consideration of risks, benefits and other options for treatment, the patient has consented to  Procedure(s): AMPUTATION OF LEFT GREAT TOE AND RIGHT FOURTH TOE (N/A) as a surgical intervention.  The patient's history has been reviewed, patient examined, no change in status, stable for surgery.  I have reviewed the patient's chart and labs.  Questions were answered to the patient's satisfaction.     [name]

## 2021-03-02 NOTE — Anesthesia Postprocedure Evaluation (Signed)
Anesthesia Post Note  Patient: Alison Gates  Procedure(s) Performed: AMPUTATION OF LEFT GREAT TOE AND RIGHT FOURTH TOE (Toe)     Patient location during evaluation: PACU Anesthesia Type: MAC Level of consciousness: awake and alert Pain management: pain level controlled Vital Signs Assessment: post-procedure vital signs reviewed and stable Respiratory status: spontaneous breathing, nonlabored ventilation, respiratory function stable and patient connected to nasal cannula oxygen Cardiovascular status: stable and blood pressure returned to baseline Postop Assessment: no apparent nausea or vomiting Anesthetic complications: no   No notable events documented.  Last Vitals:  Vitals:   03/02/21 1305 03/02/21 1320  BP: (!) 94/59 93/60  Pulse: 80 80  Resp: 16 16  Temp:  (!) 36.4 C  SpO2: 100% 100%    Last Pain:  Vitals:   03/02/21 1305  TempSrc:   PainSc: 0-No pain                 Tiajuana Amass

## 2021-03-02 NOTE — Progress Notes (Signed)
PROGRESS NOTE    Alison Gates  SWH:[id number] DOB: 11/16/1975 DOA: 02/27/2021 PCP: Pcp, No  Brief Narrative: 45/F with history of poorly controlled diabetes mellitus, prior history of osteomyelitis and right fifth toe amputation in 10/21, poor compliance with medications presented to the ED with pain and swelling and drainage involving left great toe and right fifth toe. -X-rays noted soft tissue swelling -Labs were notable for blood glucose of 501 without DKA, ESR and CRP significantly elevated -MRI concerning for osteomyelitis, orthopedics consulted  Assessment & Plan:   Sepsis, poa Osteomyelitis -Involving right fourth toe and left great toe -Prior fifth toe amputation -Orthopedics consulted, ESR and CRP significantly elevated -Continue vancomycin and ceftriaxone -MRI confirms osteomyelitis of above 2 toes, orthopedics consulting, plan for amputation of both toes today -Discontinue antibiotics tomorrow if stable -Labs in a.m.  Type 2 diabetes mellitus -Recent hemoglobin A1c was 12.5 and June -Poorly compliant with meds -Increase Semglee and meal coverage, hemoglobin A1c is 10.5 osteomyelitis -CBGs elevated this morning, will increase dose postop  DVT prophylaxis: Lovenox Code Status: Full code Family Communication: Discussed patient in detail, no family at bedside Disposition Plan:  Status is: Inpatient  Remains inpatient appropriate because: Severity of illness  Consultants:  Orthopedics Dr. Sharol Given  Procedures:   Antimicrobials:    Subjective: -Feels okay, about to go down for toe amputation this morning  Objective: Vitals:   03/02/21 0756 03/02/21 0938 03/02/21 1151 03/02/21 1205  BP: 104/70 108/70 (!) 87/60 103/64  Pulse: 94 88 84 81  Resp: 18 17 (!) 23 13  Temp: 98.2 F (36.8 C) 98.6 F (37 C) (!) 97 F (36.1 C)   TempSrc: Oral Oral    SpO2: 99% 96% 92% 94%  Weight:      Height:        Intake/Output Summary (Last 24 hours) at 03/02/2021  1220 Last data filed at 03/02/2021 0800 Gross per 24 hour  Intake 639.24 ml  Output --  Net 639.24 ml   Filed Weights   02/27/21 1608  Weight: 77.1 kg    Examination:  Gen: Awake, Alert, Oriented X 3,  HEENT: no JVD Lungs: Good air movement bilaterally, CTAB CVS: S1S2/RRR Abd: soft, Non tender, non distended, BS present Extremities: Swelling erythema and tenderness involving left great toe and right fifth toe, bounding dorsalis pedis pulses Skin: As above Psychiatry: Judgement and insight appear normal. Mood & affect appropriate.   Data Reviewed:   CBC: Recent Labs  Lab 02/27/21 1610 02/28/21 0500 03/01/21 0110  WBC 15.2* 12.5* 12.3*  NEUTROABS 12.8*  --   --   HGB 12.7 11.7* 11.0*  HCT 37.5 34.7* 33.4*  MCV 90.8 91.1 91.8  PLT 324 294 665   Basic Metabolic Panel: Recent Labs  Lab 02/27/21 1610 03/01/21 0110  NA 130* 132*  K 4.6 4.1  CL 94* 99  CO2 27 26  GLUCOSE 501* 336*  BUN 10 6  CREATININE 0.69 0.71  CALCIUM 9.2 8.4*   GFR: Estimated Creatinine Clearance: 86.3 mL/min (by C-G formula based on SCr of 0.71 mg/dL). Liver Function Tests: No results for input(s): AST, ALT, ALKPHOS, BILITOT, PROT, ALBUMIN in the last 168 hours. No results for input(s): LIPASE, AMYLASE in the last 168 hours. No results for input(s): AMMONIA in the last 168 hours. Coagulation Profile: No results for input(s): INR, PROTIME in the last 168 hours. Cardiac Enzymes: No results for input(s): CKTOTAL, CKMB, CKMBINDEX, TROPONINI in the last 168 hours. BNP (last 3 results) No results for  input(s): PROBNP in the last 8760 hours. HbA1C: Recent Labs    02/27/21 2027  HGBA1C 10.5*   CBG: Recent Labs  Lab 03/01/21 2106 03/02/21 0938 03/02/21 1035 03/02/21 1127 03/02/21 1200  GLUCAP 286* 337* 262* 212* 185*   Lipid Profile: Recent Labs    02/28/21 0500  CHOL 127  HDL 38*  LDLCALC 71  TRIG 91  CHOLHDL 3.3   Thyroid Function Tests: No results for input(s): TSH,  T4TOTAL, FREET4, T3FREE, THYROIDAB in the last 72 hours. Anemia Panel: No results for input(s): VITAMINB12, FOLATE, FERRITIN, TIBC, IRON, RETICCTPCT in the last 72 hours. Urine analysis: No results found for: COLORURINE, APPEARANCEUR, LABSPEC, PHURINE, GLUCOSEU, HGBUR, BILIRUBINUR, KETONESUR, PROTEINUR, UROBILINOGEN, NITRITE, LEUKOCYTESUR Sepsis Labs: @LABRCNTIP (procalcitonin:4,lacticidven:4)  ) Recent Results (from the past 240 hour(s))  Blood culture (routine x 2)     Status: None (Preliminary result)   Collection Time: 02/27/21  5:26 PM   Specimen: BLOOD RIGHT WRIST  Result Value Ref Range Status   Specimen Description BLOOD RIGHT WRIST  Final   Special Requests   Final    BOTTLES DRAWN AEROBIC AND ANAEROBIC Blood Culture results may not be optimal due to an inadequate volume of blood received in culture bottles   Culture   Final    NO GROWTH 2 DAYS Performed at [facility], [place] [address], [place], [place] [address]    Report Status PENDING  Incomplete  Blood culture (routine x 2)     Status: None (Preliminary result)   Collection Time: 02/27/21  5:54 PM   Specimen: Site Not Specified; Blood  Result Value Ref Range Status   Specimen Description SITE NOT SPECIFIED  Final   Special Requests   Final    BOTTLES DRAWN AEROBIC AND ANAEROBIC Blood Culture results may not be optimal due to an excessive volume of blood received in culture bottles   Culture   Final    NO GROWTH 2 DAYS Performed at [facility], [place] [address] [place], [place] [address]    Report Status PENDING  Incomplete  Resp Panel by RT-PCR (Flu A&B, Covid) Nasopharyngeal Swab     Status: None   Collection Time: 02/28/21  1:14 AM   Specimen: Nasopharyngeal Swab; Nasopharyngeal(NP) swabs in vial transport medium  Result Value Ref Range Status   SARS Coronavirus 2 by RT PCR NEGATIVE NEGATIVE Final    Comment: (NOTE) SARS-CoV-2 target nucleic acids are NOT DETECTED.  The SARS-CoV-2 RNA is generally  detectable in upper respiratory specimens during the acute phase of infection. The lowest concentration of SARS-CoV-2 viral copies this assay can detect is 138 copies/mL. A negative result does not preclude SARS-Cov-2 infection and should not be used as the sole basis for treatment or other patient management decisions. A negative result may occur with  improper specimen collection/handling, submission of specimen other than nasopharyngeal swab, presence of viral mutation(s) within the areas targeted by this assay, and inadequate number of viral copies(<138 copies/mL). A negative result must be combined with clinical observations, patient history, and epidemiological information. The expected result is Negative.  Fact Sheet for Patients:  EntrepreneurPulse.com.au  Fact Sheet for Healthcare Providers:  IncredibleEmployment.be  This test is no t yet approved or cleared by the [place] FDA and  has been authorized for detection and/or diagnosis of SARS-CoV-2 by FDA under an Emergency Use Authorization (EUA). This EUA will remain  in effect (meaning this test can be used) for the duration of the COVID-19 declaration under  Section 564(b)(1) of the Act, 21 U.S.C.section 360bbb-3(b)(1), unless the authorization is terminated  or revoked sooner.       Influenza A by PCR NEGATIVE NEGATIVE Final   Influenza B by PCR NEGATIVE NEGATIVE Final    Comment: (NOTE) The Xpert Xpress SARS-CoV-2/FLU/RSV plus assay is intended as an aid in the diagnosis of influenza from Nasopharyngeal swab specimens and should not be used as a sole basis for treatment. Nasal washings and aspirates are unacceptable for Xpert Xpress SARS-CoV-2/FLU/RSV testing.  Fact Sheet for Patients: EntrepreneurPulse.com.au  Fact Sheet for Healthcare Providers: IncredibleEmployment.be  This test is not yet approved or cleared by the [place] FDA  and has been authorized for detection and/or diagnosis of SARS-CoV-2 by FDA under an Emergency Use Authorization (EUA). This EUA will remain in effect (meaning this test can be used) for the duration of the COVID-19 declaration under Section 564(b)(1) of the Act, 21 U.S.C. section 360bbb-3(b)(1), unless the authorization is terminated or revoked.  Performed at [facility], [place] [address], [place], [place] [address]   Surgical pcr screen     Status: None   Collection Time: 03/01/21  7:56 PM   Specimen: Nasal Mucosa; Nasal Swab  Result Value Ref Range Status   MRSA, PCR NEGATIVE NEGATIVE Final   Staphylococcus aureus NEGATIVE NEGATIVE Final    Comment: (NOTE) The Xpert SA Assay (FDA approved for NASAL specimens in patients 11 years of age and older), is one component of a comprehensive surveillance program. It is not intended to diagnose infection nor to guide or monitor treatment. Performed at [facility], [place] [address] [place], [place] [address]          Radiology Studies: No results found.  Scheduled Meds:  [MAR Hold] enoxaparin (LOVENOX) injection  40 mg Subcutaneous Q24H   fentaNYL       [MAR Hold] insulin aspart  0-15 Units Subcutaneous TID WC   [MAR Hold] insulin aspart  5 Units Subcutaneous TID WC   [MAR Hold] insulin glargine-yfgn  30 Units Subcutaneous QHS   [MAR Hold] insulin starter kit- pen needles  1 kit Other Once   [MAR Hold] living well with diabetes book- in spanish   Does not apply Once   [facility] Hold] nutrition supplement (JUVEN)  1 packet Oral BID BM   Continuous Infusions:   ceFAZolin (ANCEF) IV     [MAR Hold] cefTRIAXone (ROCEPHIN)  IV 0 g (03/01/21 1037)   lactated ringers 10 mL/hr at 03/02/21 0945   [MAR Hold] vancomycin 1,250 mg (03/02/21 0903)     LOS: 3 days    Time spent: 63mn  PDomenic Polite MD [facility]   03/02/2021, 12:20 PM

## 2021-03-03 ENCOUNTER — Encounter (HOSPITAL_COMMUNITY): Payer: Self-pay | Admitting: Orthopedic Surgery

## 2021-03-03 LAB — CBC
HCT: 31.8 % — ABNORMAL LOW (ref 36.0–46.0)
Hemoglobin: 10.4 g/dL — ABNORMAL LOW (ref 12.0–15.0)
MCH: 29.9 pg (ref 26.0–34.0)
MCHC: 32.7 g/dL (ref 30.0–36.0)
MCV: 91.4 fL (ref 80.0–100.0)
Platelets: 297 10*3/uL (ref 150–400)
RBC: 3.48 MIL/uL — ABNORMAL LOW (ref 3.87–5.11)
RDW: 11.4 % — ABNORMAL LOW (ref 11.5–15.5)
WBC: 9.2 10*3/uL (ref 4.0–10.5)
nRBC: 0 % (ref 0.0–0.2)

## 2021-03-03 LAB — BASIC METABOLIC PANEL
Anion gap: 7 (ref 5–15)
BUN: 11 mg/dL (ref 6–20)
CO2: 27 mmol/L (ref 22–32)
Calcium: 8.4 mg/dL — ABNORMAL LOW (ref 8.9–10.3)
Chloride: 97 mmol/L — ABNORMAL LOW (ref 98–111)
Creatinine, Ser: 0.65 mg/dL (ref 0.44–1.00)
GFR, Estimated: >60 mL/min (ref >60)
Glucose, Bld: 255 mg/dL — ABNORMAL HIGH (ref 70–99)
Potassium: 3.7 mmol/L (ref 3.5–5.1)
Sodium: 131 mmol/L — ABNORMAL LOW (ref 135–145)

## 2021-03-03 LAB — GLUCOSE, CAPILLARY
Glucose-Capillary: 254 mg/dL — ABNORMAL HIGH (ref 70–99)
Glucose-Capillary: 261 mg/dL — ABNORMAL HIGH (ref 70–99)
Glucose-Capillary: 127 mg/dL — ABNORMAL HIGH (ref 70–99)
Glucose-Capillary: 234 mg/dL — ABNORMAL HIGH (ref 70–99)

## 2021-03-03 MED ORDER — INSULIN ASPART 100 UNIT/ML IJ SOLN
6.0000 [IU] | Freq: Three times a day (TID) | INTRAMUSCULAR | Status: DC
  Administered 2021-03-03 – 2021-03-04 (×3): 6 [IU] via SUBCUTANEOUS

## 2021-03-03 MED ORDER — INSULIN GLARGINE-YFGN 100 UNIT/ML ~~LOC~~ SOLN
40.0000 [IU] | Freq: Every day | SUBCUTANEOUS | Status: DC
Start: 2021-03-03 — End: 2021-03-04
  Administered 2021-03-03: 40 [IU] via SUBCUTANEOUS
  Filled 2021-03-03 (×3): qty 0.4

## 2021-03-03 NOTE — Evaluation (Signed)
Physical Therapy Evaluation Patient Details Name: Alison Gates MRN: [id number] DOB: [birth year] Today's Date: 03/03/2021  History of Present Illness  45 y/o female presented to ED on 11/22 for L great toe swelling. MRI showed osteomyelitis of R fourth toe and L great toe distal phalanx. S/p L great toe and R 4th toe amputation on 11/25. PMH: R 5th toe amp, diabetes  Clinical Impression  Patient admitted with above diagnosis. Patient presents with post op weakness, impaired balance, and decreased activity tolerance. Patient is Spanish speaking and video interpreter utilized. Patient at supervision level for ambulation with use of RW. Educated patient on Wb restrictions and mobility restrictions per Dr. Lajoyce Corners, patient verbalized understanding. Patient will benefit from skilled PT services during acute stay to address listed deficits. No PT follow up recommended at this time.        Recommendations for follow up therapy are one component of a multi-disciplinary discharge planning process, led by the attending physician.  Recommendations may be updated based on patient status, additional functional criteria and insurance authorization.  Follow Up Recommendations No PT follow up    Assistance Recommended at Discharge Intermittent Supervision/Assistance  Functional Status Assessment Patient has had a recent decline in their functional status and demonstrates the ability to make significant improvements in function in a reasonable and predictable amount of time.  Equipment Recommendations  Rolling [name] (2 wheels)    Recommendations for Other Services       Precautions / Restrictions Precautions Precautions: Fall Precaution Comments: bilateral post op shoes Restrictions Weight Bearing Restrictions: Yes RLE Weight Bearing: Weight bearing as tolerated LLE Weight Bearing: Weight bearing as tolerated Other Position/Activity Restrictions: per Dr. Lajoyce Corners notes, limit ambulation to prevent stress on  surgery sites      Mobility  Bed Mobility Overal bed mobility: Modified Independent                  Transfers Overall transfer level: Needs assistance Equipment used: Rolling [name] (2 wheels) Transfers: Sit to/from Stand Sit to Stand: Supervision           General transfer comment: supervision for safety, cues for hand placement    Ambulation/Gait Ambulation/Gait assistance: Supervision Gait Distance (Feet): 20 Feet Assistive device: Rolling [name] (2 wheels) Gait Pattern/deviations: Step-through pattern;Decreased stride length;Decreased weight shift to left;Decreased weight shift to right Gait velocity: decreased     General Gait Details: heavy use of UEs to offweight bilateral LEs during ambulation. Supervision for safety. No physical assist needed or LOB noted.  Stairs            Wheelchair Mobility    Modified Rankin (Stroke Patients Only)       Balance Overall balance assessment: Mild deficits observed, not formally tested                                           Pertinent Vitals/Pain Pain Assessment: Faces Faces Pain Scale: Hurts even more Pain Location: L>R foot Pain Descriptors / Indicators: Grimacing;Guarding Pain Intervention(s): Monitored during session    Home Living Family/patient expects to be discharged to:: Private residence Living Arrangements: Children Available Help at Discharge: Family;Available PRN/intermittently Type of Home: House Home Access: Level entry       Home Layout: One level Home Equipment: Agricultural consultant (2 wheels)      Prior Function Prior Level of Function : Independent/Modified Independent;Driving;Working/employed  Hand Dominance        Extremity/Trunk Assessment   Upper Extremity Assessment Upper Extremity Assessment: Overall WFL for tasks assessed    Lower Extremity Assessment Lower Extremity Assessment: Generalized weakness (post op  weakness)    Cervical / Trunk Assessment Cervical / Trunk Assessment: Normal  Communication   Communication: Prefers language other than [name];Interpreter utilized  Cognition Arousal/Alertness: Awake/alert Behavior During Therapy: WFL for tasks assessed/performed Overall Cognitive Status: Within Functional Limits for tasks assessed                                          General Comments      Exercises     Assessment/Plan    PT Assessment Patient needs continued PT services  PT Problem List Decreased strength;Decreased activity tolerance;Decreased balance;Decreased mobility;Decreased range of motion;Decreased knowledge of precautions       PT Treatment Interventions DME instruction;Gait training;Functional mobility training;Therapeutic activities;Therapeutic exercise;Balance training;Patient/family education    PT Goals (Current goals can be found in the Care Plan section)  Acute Rehab PT Goals Patient Stated Goal: to go home PT Goal Formulation: With patient Time For Goal Achievement: 03/17/21 Potential to Achieve Goals: Good    Frequency Min 4X/week   Barriers to discharge        Co-evaluation               AM-PAC PT "6 Clicks" Mobility  Outcome Measure Help needed turning from your back to your side while in a flat bed without using bedrails?: None Help needed moving from lying on your back to sitting on the side of a flat bed without using bedrails?: None Help needed moving to and from a bed to a chair (including a wheelchair)?: A Little Help needed standing up from a chair using your arms (e.g., wheelchair or bedside chair)?: A Little Help needed to walk in hospital room?: A Little Help needed climbing 3-5 steps with a railing? : A Little 6 Click Score: 20    End of Session Equipment Utilized During Treatment: Other (comment) (post op shoes) Activity Tolerance: Patient tolerated treatment well Patient left: in bed;with call  bell/phone within reach Nurse Communication: Mobility status PT Visit Diagnosis: Unsteadiness on feet (R26.81);Muscle weakness (generalized) (M62.81);Difficulty in walking, not elsewhere classified (R26.2)    Time: 7096-2836 PT Time Calculation (min) (ACUTE ONLY): 17 min   Charges:   PT Evaluation $PT Eval Low Complexity: 1 Low          Candra Wegner A. Dan Humphreys PT, DPT Acute Rehabilitation Services Pager [phone] Office [phone]   Viviann Spare 03/03/2021, 3:46 PM

## 2021-03-03 NOTE — Progress Notes (Signed)
   Subjective:  Patient reports pain as mild.  Well controlled with medication.  Objective:   VITALS:   Vitals:   03/02/21 1745 03/02/21 2021 03/03/21 0344 03/03/21 0748  BP: 100/67 111/68 108/74 (!) 98/54  Pulse: 86 90 85 84  Resp: 18 16 16 18   Temp: 98.4 F (36.9 C) 98.8 F (37.1 C) 98.7 F (37.1 C) 99 F (37.2 C)  TempSrc: Oral Oral Oral Oral  SpO2: 100% 99% 97% 94%  Weight:      Height:        Left foot dressing is clean dry and intact.  No Stange on the dressing.  Coban is in place.  Lab Results  Component Value Date   WBC 9.2 03/03/2021   HGB 10.4 (L) 03/03/2021   HCT 31.8 (L) 03/03/2021   MCV 91.4 03/03/2021   PLT 297 03/03/2021     Assessment/Plan:  1 Day Post-Op status post amputation of left great toe and fourth toe  -Recommend physical therapy for mobility assessment -On Lovenox for DVT prophylaxis -Appreciate primary medical care   Alison Gates 03/03/2021, 11:00 AM

## 2021-03-03 NOTE — Progress Notes (Signed)
PROGRESS NOTE    Alison Gates  HWT:[id number] DOB: 11/24/1975 DOA: 02/27/2021 PCP: Pcp, No  Brief Narrative: 45/F with history of poorly controlled diabetes mellitus, prior history of osteomyelitis and right fifth toe amputation in 10/21, poor compliance with medications presented to the ED with pain and swelling and drainage involving left great toe and right fifth toe. -X-rays noted soft tissue swelling -Labs were notable for blood glucose of 501 without DKA, ESR and CRP significantly elevated -MRI concerning for osteomyelitis, orthopedics consulted  Assessment & Plan:   Sepsis, poa Osteomyelitis -Involving right fourth toe and left great toe -Prior fifth toe amputation -Orthopedics consulted, ESR and CRP significantly elevated -Treated with broad-spectrum antibiotics, underwent amputation of both toes yesterday -Discontinue antibiotics today -PT eval, discharge planning  Type 2 diabetes mellitus -Recent hemoglobin A1c was 12.5 and June -Poorly compliant with meds -Increase Semglee and meal coverage, hemoglobin A1c is 10.5 osteomyelitis -CBGs elevated this morning, increase dose further  DVT prophylaxis: Lovenox Code Status: Full code Family Communication: Discussed patient in detail, no family at bedside Disposition Plan: Home tomorrow if stable Status is: Inpatient  Remains inpatient appropriate because: Severity of illness  Consultants:  Orthopedics Dr. Sharol Given  Procedures: Left great toe and right fourth toe amputation Dr. Sharol Given on 11/25  Antimicrobials:    Subjective: -Feels okay overall, underwent amputation feels okay, waiting for breakfast, some pain at the amputation site yesterday  Objective: Vitals:   03/02/21 1745 03/02/21 2021 03/03/21 0344 03/03/21 0748  BP: 100/67 111/68 108/74 (!) 98/54  Pulse: 86 90 85 84  Resp: 18 16 16 18   Temp: 98.4 F (36.9 C) 98.8 F (37.1 C) 98.7 F (37.1 C) 99 F (37.2 C)  TempSrc: Oral Oral Oral Oral  SpO2: 100% 99%  97% 94%  Weight:      Height:        Intake/Output Summary (Last 24 hours) at 03/03/2021 1211 Last data filed at 03/03/2021 1148 Gross per 24 hour  Intake 1201.23 ml  Output --  Net 1201.23 ml   Filed Weights   02/27/21 1608  Weight: 77.1 kg    Examination:  Gen: Gen: Awake, Alert, Oriented X 3,  HEENT: no JVD Lungs: Good air movement bilaterally, CTAB CVS: S1S2/RRR Abd: soft, Non tender, non distended, BS present Extremities:  s/p amputation of left great toe and right fourth toe with dressing Skin: As above Psychiatry: Judgement and insight appear normal. Mood & affect appropriate.   Data Reviewed:   CBC: Recent Labs  Lab 02/27/21 1610 02/28/21 0500 03/01/21 0110 03/03/21 0115  WBC 15.2* 12.5* 12.3* 9.2  NEUTROABS 12.8*  --   --   --   HGB 12.7 11.7* 11.0* 10.4*  HCT 37.5 34.7* 33.4* 31.8*  MCV 90.8 91.1 91.8 91.4  PLT 324 294 312 917   Basic Metabolic Panel: Recent Labs  Lab 02/27/21 1610 03/01/21 0110 03/03/21 0115  NA 130* 132* 131*  K 4.6 4.1 3.7  CL 94* 99 97*  CO2 27 26 27   GLUCOSE 501* 336* 255*  BUN 10 6 11   CREATININE 0.69 0.71 0.65  CALCIUM 9.2 8.4* 8.4*   GFR: Estimated Creatinine Clearance: 86.3 mL/min (by C-G formula based on SCr of 0.65 mg/dL). Liver Function Tests: No results for input(s): AST, ALT, ALKPHOS, BILITOT, PROT, ALBUMIN in the last 168 hours. No results for input(s): LIPASE, AMYLASE in the last 168 hours. No results for input(s): AMMONIA in the last 168 hours. Coagulation Profile: No results for input(s): INR, PROTIME  in the last 168 hours. Cardiac Enzymes: No results for input(s): CKTOTAL, CKMB, CKMBINDEX, TROPONINI in the last 168 hours. BNP (last 3 results) No results for input(s): PROBNP in the last 8760 hours. HbA1C: No results for input(s): HGBA1C in the last 72 hours.  CBG: Recent Labs  Lab 03/02/21 1200 03/02/21 1747 03/02/21 2053 03/03/21 0752 03/03/21 1145  GLUCAP 185* 169* 263* 254* 261*   Lipid  Profile: No results for input(s): CHOL, HDL, LDLCALC, TRIG, CHOLHDL, LDLDIRECT in the last 72 hours.  Thyroid Function Tests: No results for input(s): TSH, T4TOTAL, FREET4, T3FREE, THYROIDAB in the last 72 hours. Anemia Panel: No results for input(s): VITAMINB12, FOLATE, FERRITIN, TIBC, IRON, RETICCTPCT in the last 72 hours. Urine analysis: No results found for: COLORURINE, APPEARANCEUR, LABSPEC, PHURINE, GLUCOSEU, HGBUR, BILIRUBINUR, KETONESUR, PROTEINUR, UROBILINOGEN, NITRITE, LEUKOCYTESUR Sepsis Labs: @LABRCNTIP (procalcitonin:4,lacticidven:4)  ) Recent Results (from the past 240 hour(s))  Blood culture (routine x 2)     Status: None (Preliminary result)   Collection Time: 02/27/21  5:26 PM   Specimen: BLOOD RIGHT WRIST  Result Value Ref Range Status   Specimen Description BLOOD RIGHT WRIST  Final   Special Requests   Final    BOTTLES DRAWN AEROBIC AND ANAEROBIC Blood Culture results may not be optimal due to an inadequate volume of blood received in culture bottles   Culture   Final    NO GROWTH 4 DAYS Performed at [facility], [place] [address] [place], [place] [address]    Report Status PENDING  Incomplete  Blood culture (routine x 2)     Status: None (Preliminary result)   Collection Time: 02/27/21  5:54 PM   Specimen: Site Not Specified; Blood  Result Value Ref Range Status   Specimen Description SITE NOT SPECIFIED  Final   Special Requests   Final    BOTTLES DRAWN AEROBIC AND ANAEROBIC Blood Culture results may not be optimal due to an excessive volume of blood received in culture bottles   Culture   Final    NO GROWTH 4 DAYS Performed at [facility], [place] [address], [place], [place] [address]    Report Status PENDING  Incomplete  Resp Panel by RT-PCR (Flu A&B, Covid) Nasopharyngeal Swab     Status: None   Collection Time: 02/28/21  1:14 AM   Specimen: Nasopharyngeal Swab; Nasopharyngeal(NP) swabs in vial transport medium  Result Value Ref Range Status    SARS Coronavirus 2 by RT PCR NEGATIVE NEGATIVE Final    Comment: (NOTE) SARS-CoV-2 target nucleic acids are NOT DETECTED.  The SARS-CoV-2 RNA is generally detectable in upper respiratory specimens during the acute phase of infection. The lowest concentration of SARS-CoV-2 viral copies this assay can detect is 138 copies/mL. A negative result does not preclude SARS-Cov-2 infection and should not be used as the sole basis for treatment or other patient management decisions. A negative result may occur with  improper specimen collection/handling, submission of specimen other than nasopharyngeal swab, presence of viral mutation(s) within the areas targeted by this assay, and inadequate number of viral copies(<138 copies/mL). A negative result must be combined with clinical observations, patient history, and epidemiological information. The expected result is Negative.  Fact Sheet for Patients:  EntrepreneurPulse.com.au  Fact Sheet for Healthcare Providers:  IncredibleEmployment.be  This test is no t yet approved or cleared by the [place] FDA and  has been authorized for detection and/or diagnosis of SARS-CoV-2 by FDA under an Emergency Use Authorization (EUA). This EUA will  remain  in effect (meaning this test can be used) for the duration of the COVID-19 declaration under Section 564(b)(1) of the Act, 21 U.S.C.section 360bbb-3(b)(1), unless the authorization is terminated  or revoked sooner.       Influenza A by PCR NEGATIVE NEGATIVE Final   Influenza B by PCR NEGATIVE NEGATIVE Final    Comment: (NOTE) The Xpert Xpress SARS-CoV-2/FLU/RSV plus assay is intended as an aid in the diagnosis of influenza from Nasopharyngeal swab specimens and should not be used as a sole basis for treatment. Nasal washings and aspirates are unacceptable for Xpert Xpress SARS-CoV-2/FLU/RSV testing.  Fact Sheet for  Patients: EntrepreneurPulse.com.au  Fact Sheet for Healthcare Providers: IncredibleEmployment.be  This test is not yet approved or cleared by the [place] FDA and has been authorized for detection and/or diagnosis of SARS-CoV-2 by FDA under an Emergency Use Authorization (EUA). This EUA will remain in effect (meaning this test can be used) for the duration of the COVID-19 declaration under Section 564(b)(1) of the Act, 21 U.S.C. section 360bbb-3(b)(1), unless the authorization is terminated or revoked.  Performed at [facility], [place] [address], [place], [place] [address]   Surgical pcr screen     Status: None   Collection Time: 03/01/21  7:56 PM   Specimen: Nasal Mucosa; Nasal Swab  Result Value Ref Range Status   MRSA, PCR NEGATIVE NEGATIVE Final   Staphylococcus aureus NEGATIVE NEGATIVE Final    Comment: (NOTE) The Xpert SA Assay (FDA approved for NASAL specimens in patients 18 years of age and older), is one component of a comprehensive surveillance program. It is not intended to diagnose infection nor to guide or monitor treatment. Performed at [facility], [place] [address] [place], [place] [address]      Radiology Studies: No results found.  Scheduled Meds:  vitamin C  1,000 mg Oral Daily   docusate sodium  100 mg Oral Daily   enoxaparin (LOVENOX) injection  40 mg Subcutaneous Q24H   insulin aspart  0-15 Units Subcutaneous TID WC   insulin aspart  5 Units Subcutaneous TID WC   insulin glargine-yfgn  30 Units Subcutaneous QHS   insulin starter kit- pen needles  1 kit Other Once   living well with diabetes book- in spanish   Does not apply Once   nutrition supplement (JUVEN)  1 packet Oral BID BM   pantoprazole  40 mg Oral Daily   zinc sulfate  220 mg Oral Daily   Continuous Infusions:  sodium chloride Stopped (03/02/21 2237)   cefTRIAXone (ROCEPHIN)  IV 2 g (03/03/21 1022)   magnesium sulfate bolus IVPB      vancomycin Stopped (03/03/21 0135)     LOS: 4 days    Time spent: 76mn  PDomenic Polite MD [facility]   03/03/2021, 12:11 PM

## 2021-03-04 LAB — CULTURE, BLOOD (ROUTINE X 2)
Culture: NO GROWTH
Culture: NO GROWTH

## 2021-03-04 LAB — BASIC METABOLIC PANEL
Anion gap: 8 (ref 5–15)
BUN: 11 mg/dL (ref 6–20)
CO2: 29 mmol/L (ref 22–32)
Calcium: 8.7 mg/dL — ABNORMAL LOW (ref 8.9–10.3)
Chloride: 96 mmol/L — ABNORMAL LOW (ref 98–111)
Creatinine, Ser: 0.62 mg/dL (ref 0.44–1.00)
GFR, Estimated: >60 mL/min (ref >60)
Glucose, Bld: 199 mg/dL — ABNORMAL HIGH (ref 70–99)
Potassium: 3.8 mmol/L (ref 3.5–5.1)
Sodium: 133 mmol/L — ABNORMAL LOW (ref 135–145)

## 2021-03-04 LAB — GLUCOSE, CAPILLARY
Glucose-Capillary: 172 mg/dL — ABNORMAL HIGH (ref 70–99)
Glucose-Capillary: 209 mg/dL — ABNORMAL HIGH (ref 70–99)
Glucose-Capillary: 276 mg/dL — ABNORMAL HIGH (ref 70–99)

## 2021-03-04 LAB — CBC
HCT: 32.7 % — ABNORMAL LOW (ref 36.0–46.0)
Hemoglobin: 11.2 g/dL — ABNORMAL LOW (ref 12.0–15.0)
MCH: 30.8 pg (ref 26.0–34.0)
MCHC: 34.3 g/dL (ref 30.0–36.0)
MCV: 89.8 fL (ref 80.0–100.0)
Platelets: 363 10*3/uL (ref 150–400)
RBC: 3.64 MIL/uL — ABNORMAL LOW (ref 3.87–5.11)
RDW: 11.4 % — ABNORMAL LOW (ref 11.5–15.5)
WBC: 9.9 10*3/uL (ref 4.0–10.5)
nRBC: 0 % (ref 0.0–0.2)

## 2021-03-04 MED ORDER — NOVOLIN 70/30 FLEXPEN RELION (70-30) 100 UNIT/ML ~~LOC~~ SUPN: 25.0000 [IU] | PEN_INJECTOR | Freq: Two times a day (BID) | SUBCUTANEOUS | 1 refills | Status: AC

## 2021-03-04 MED ORDER — OXYCODONE HCL 5 MG PO TABS: 5.0000 mg | ORAL_TABLET | Freq: Four times a day (QID) | ORAL | 0 refills | Status: AC | PRN

## 2021-03-04 MED ORDER — INSULIN PEN NEEDLE 32G X 4 MM MISC
20.0000 [IU] | Freq: Two times a day (BID) | 1 refills | Status: AC
Start: 2021-03-04 — End: ?

## 2021-03-04 MED ORDER — SENNOSIDES-DOCUSATE SODIUM 8.6-50 MG PO TABS: 1.0000 | ORAL_TABLET | Freq: Every day | ORAL | 0 refills | Status: AC

## 2021-03-04 MED ORDER — ACETAMINOPHEN 325 MG PO TABS: 325.0000 mg | ORAL_TABLET | Freq: Four times a day (QID) | ORAL | Status: AC | PRN

## 2021-03-04 NOTE — Progress Notes (Signed)
Inpatient Diabetes Program Recommendations  AACE/ADA: New Consensus Statement on Inpatient Glycemic Control (2015)  Target Ranges:  Prepandial:   less than 140 mg/dL      Peak postprandial:   less than 180 mg/dL (1-2 hours)      Critically ill patients:  140 - 180 mg/dL   Lab Results  Component Value Date   GLUCAP 276 (H) 03/04/2021   HGBA1C 10.5 (H) 02/27/2021    Review of Glycemic Control  Latest Reference Range & Units 03/04/21 06:59 03/04/21 08:08 03/04/21 11:59  Glucose-Capillary 70 - 99 mg/dL 290 (H) 211 (H) 155 (H)    Inpatient Diabetes Program Recommendations:   Received consult regarding diabetes management. Please consider: -Increase Novolog meal coverage to 8 units it if eats 50% -Add Novolog correction 0-5 units hs Secure chat sent to Dr. Jomarie Longs.  Thank you, Alison Gates. Cayci Mcnabb, RN, MSN, CDE  Diabetes Coordinator Inpatient Glycemic Control Team Team Pager [phone] (8am-5pm) 03/04/2021 1:12 PM

## 2021-03-04 NOTE — Progress Notes (Signed)
Discharge teaching was given to patient with interpreter's help, including her meds, follow up appointments, wound catr, CBG and insulin admin's etc. Patient verbalized of understandings.

## 2021-03-04 NOTE — Discharge Summary (Signed)
Physician Discharge Summary  Alison Gates ACZ:[id number] DOB: Aug 11, 1975 DOA: 02/27/2021  PCP: Pcp, No  Admit date: 02/27/2021 Discharge date: 03/04/2021  Time spent:  35 minutes  Recommendations for Outpatient Follow-up:  Orthopedics Dr. Sharol Given in 1 week PCP in 1 week,  titrate insulin dose at follow-up   Discharge Diagnoses:  Osteomyelitis of toes   Poorly controlled diabetes mellitus ([place])   Sepsis ([place])   Peripheral arterial disease ([place])   Diabetic foot infection ([place])   Discharge Condition: Stable  Diet recommendation: Diabetic  Filed Weights   02/27/21 1608  Weight: 77.1 kg    History of present illness:  45/F with history of poorly controlled diabetes mellitus, prior history of osteomyelitis and right fifth toe amputation in 10/21, poor compliance with medications presented to the ED with pain and swelling and drainage involving left great toe and right fifth toe. -X-rays noted soft tissue swelling -Labs were notable for blood glucose of 501 without DKA, ESR and CRP significantly elevated -MRI concerning for osteomyelitis, orthopedics consulted   Hospital Course:   Sepsis, poa Osteomyelitis -Involving right fourth toe and left great toe -Prior fifth toe amputation -Orthopedics consulted, ESR and CRP significantly elevated -Treated with broad-spectrum antibiotics, underwent amputation of both toes on 11/25 -Discontinue antibiotics yesterday -PT eval completed, ambulating in postop shoes -Discharged home in a stable condition, advised follow-up with Dr. Sharol Given in 1 week   Type 2 diabetes mellitus -Recent hemoglobin A1c was 12.5 in June, repeat was 10.5 now -Previously was poorly compliant with meds -Treated with Semglee and meal coverage NovoLog, transition to insulin 70/30 at discharge -Follow-up with PCP in 1 week    Consultants:  Orthopedics Dr. Sharol Given   Procedures: Left great toe and right fourth toe amputation Dr. Sharol Given on 11/25    Discharge  Exam: Vitals:   03/04/21 0503 03/04/21 0806  BP: 94/64 110/74  Pulse: 90 86  Resp: 18 18  Temp: 99.9 F (37.7 C) 99.3 F (37.4 C)  SpO2: 95% 97%   Gen: Gen: Awake, Alert, Oriented X 3,  HEENT: no JVD Lungs: Good air movement bilaterally, CTAB CVS: S1S2/RRR Abd: soft, Non tender, non distended, BS present Extremities:  s/p amputation of left great toe and right fourth toe with dressing Skin: As above Psychiatry: Judgement and insight appear normal. Mood & affect appropriate.   Discharge Instructions   Discharge Instructions     Diet Carb Modified   Complete by: As directed    Discharge wound care:   Complete by: As directed    Do not change dressing until seen by Dr.Duda next week   Increase activity slowly   Complete by: As directed    Weight bearing as tolerated   Complete by: As directed    Laterality: bilateral   Extremity: Lower      Allergies as of 03/04/2021   No Known Allergies      Medication List     STOP taking these medications    gabapentin 100 MG capsule Commonly known as: NEURONTIN       TAKE these medications    acetaminophen 325 MG tablet Commonly known as: TYLENOL Take 1-2 tablets (325-650 mg total) by mouth every 6 (six) hours as needed for mild pain (pain score 1-3 or temp > 100.5).   glyBURIDE-metformin 5-500 MG tablet Commonly known as: GLUCOVANCE Take 1 tablet by mouth 2 (two) times daily.   ibuprofen 200 MG tablet Commonly known as: ADVIL Take 400 mg by mouth every 6 (six) hours  as needed for fever, headache or mild pain.   Insulin Pen Needle 32G X 4 MM Misc 20 Units by Does not apply route 2 (two) times daily.   NovoLIN 70/30 Kwikpen (70-30) 100 UNIT/ML KwikPen Generic drug: insulin isophane & regular human KwikPen Inject 25 Units into the skin 2 (two) times daily with a meal.   oxyCODONE 5 MG immediate release tablet Commonly known as: Oxy IR/ROXICODONE Take 1 tablet (5 mg total) by mouth every 6 (six) hours as  needed for up to 7 days for moderate pain or severe pain (pain score 4-6).   Ozempic (0.25 or 0.5 MG/DOSE) 2 MG/1.5ML Sopn Generic drug: Semaglutide(0.25 or 0.5MG/DOS) Inject 0.25 mg into the skin once a week.   senna-docusate 8.6-50 MG tablet Commonly known as: Senokot-S Take 1 tablet by mouth daily for 10 days.   vitamin B-12 500 MCG tablet Commonly known as: CYANOCOBALAMIN Take 500 mcg by mouth daily.               Discharge Care Instructions  (From admission, onward)           Start     Ordered   03/04/21 0000  Discharge wound care:       Comments: Do not change dressing until seen by Dr.Duda next week   03/04/21 0944   03/02/21 0000  Weight bearing as tolerated       Question Answer Comment  Laterality bilateral   Extremity Lower      03/02/21 1152           No Known Allergies  Follow-up Information     Newt Minion, MD. Call in 1 week(s).   Specialty: Orthopedic Surgery Why: Call office on Monday for Follow up next week Contact information: [address] [place] [address] [phone]         PCP. Schedule an appointment as soon as possible for a visit in 1 week(s).                   The results of significant diagnostics from this hospitalization (including imaging, microbiology, ancillary and laboratory) are listed below for reference.    Significant Diagnostic Studies: MR FOOT RIGHT W WO CONTRAST  Result Date: 02/28/2021 CLINICAL DATA:  Pain and swelling of the right fourth toe. History of prior amputation of the fifth toe. EXAM: MRI OF THE RIGHT FOREFOOT WITHOUT AND WITH CONTRAST TECHNIQUE: Multiplanar, multisequence MR imaging of the right foot was performed before and after the administration of intravenous contrast. CONTRAST:  71m GADAVIST GADOBUTROL 1 MMOL/ML IV SOLN COMPARISON:  MRI 01/25/2020 FINDINGS: Severe subcutaneous soft tissue swelling/edema and fluid involving the fourth toe. There is an open wound along the  dorsum of the toe and gas in the soft tissues. Abnormal T1 and T2 signal intensity and advanced erosive changes involving the middle and distal phalanges consistent with osteomyelitis. Probable septic arthritis at the distal interphalangeal joint. After contrast administration there are areas of nonenhancing soft tissue which could reflect necrotic tissue. Prior amputation of the fifth toe and most of the fifth metatarsal. Moderate forefoot and midfoot degenerative changes but I do not see any other sites worrisome for septic arthritis or osteomyelitis. Mild diffuse subcutaneous soft tissue swelling/edema mainly along the dorsum of the foot consistent with cellulitis. No findings for myofasciitis or pyomyositis. IMPRESSION: 1. Osteomyelitis involving the middle and distal phalanges of the fourth toe as detailed above. 2. Cellulitis without discrete drainable soft tissue abscess. 3. No findings  for myofasciitis or pyomyositis. Electronically Signed   By: Marijo Sanes M.D.   On: 02/28/2021 08:07   MR FOOT LEFT W WO CONTRAST  Result Date: 02/28/2021 CLINICAL DATA:  Pain and swelling of the great toe. EXAM: MRI OF THE LEFT FOREFOOT WITHOUT AND WITH CONTRAST TECHNIQUE: Multiplanar, multisequence MR imaging of the left foot was performed both before and after administration of intravenous contrast. CONTRAST:  30m GADAVIST GADOBUTROL 1 MMOL/ML IV SOLN COMPARISON:  Radiographs 02/27/2021 FINDINGS: There is marked soft tissue swelling/edema/fluid surrounding the great toe. I do not see an obvious open wound or gas in the soft tissues. Curvilinear fluid collection along the medial and plantar aspect of the great toe consistent with an abscess measuring a maximum of 2.4 cm. Abnormal T1 and T2 signal intensity and subsequent enhancement in the distal phalanx consistent with osteomyelitis. There is a small joint effusion at the interphalangeal joint but I do not see any definite findings for septic arthritis. The other  bony structures are intact. No other sites of osteomyelitis are identified. No findings for myofasciitis or pyomyositis. IMPRESSION: 1. 2.4 cm abscess along the medial and plantar aspect of the great toe with significant surrounding cellulitis. 2. Osteomyelitis involving the distal phalanx of the great toe. 3. Small joint effusion at the interphalangeal joint of the great toe but I do not see any definite findings for septic arthritis. 4. No findings for myofasciitis or pyomyositis. Electronically Signed   By: PMarijo SanesM.D.   On: 02/28/2021 08:13   DG Chest Port 1 View  Result Date: 02/28/2021 CLINICAL DATA:  Cough. EXAM: PORTABLE CHEST 1 VIEW COMPARISON:  None. FINDINGS: The heart size and mediastinal contours are within normal limits. Both lungs are clear. The visualized skeletal structures are unremarkable. IMPRESSION: No active disease. Electronically Signed   By: AAnner CreteM.D.   On: 02/28/2021 01:34   DG Foot 2 Views Right  Result Date: 02/27/2021 CLINICAL DATA:  fourth toe swelling and injury. evaluate for osteomyelitis EXAM: RIGHT FOOT - 2 VIEW COMPARISON:  X-ray right foot 01/24/2020 FINDINGS: Right fifth metatarsal amputation-Clear margins, no definite cortical erosion or destruction. Subcutaneus soft tissue edema and emphysema along the distal fourth digit with query trace cortical erosion of the phalangeal tuft-limited evaluation with no oblique view obtained. There is no evidence of fracture or dislocation. No severe degenerative changes. IMPRESSION: Subcutaneus soft tissue edema and emphysema along the distal fourth digit with query trace cortical erosion of the phalangeal tuft-limited evaluation with no oblique view obtained. Consider further evaluation with an oblique view of the foot versus MRI for further evaluation (with intravenous contrast if GFR greater than 30). Electronically Signed   By: MIven FinnM.D.   On: 02/27/2021 18:20   DG Foot Complete Left  Result  Date: 02/27/2021 CLINICAL DATA:  45year old female with a great toe wound EXAM: LEFT FOOT - COMPLETE 3+ VIEW COMPARISON:  None. FINDINGS: No acute displaced fracture.  No erosive changes of the first digit. No subluxation/dislocation. Soft tissue swelling of the great toe.  No subcutaneous gas. No radiopaque foreign body. Medial calcinosis of the tibial arteries. IMPRESSION: Soft tissue swelling of the great toe compatible with cellulitis, with no evidence of osteomyelitis on the plain film. Tibial artery atherosclerosis Electronically Signed   By: JCorrie MckusickD.O.   On: 02/27/2021 16:33    Microbiology: Recent Results (from the past 240 hour(s))  Blood culture (routine x 2)     Status: None   Collection  Time: 02/27/21  5:26 PM   Specimen: BLOOD RIGHT WRIST  Result Value Ref Range Status   Specimen Description BLOOD RIGHT WRIST  Final   Special Requests   Final    BOTTLES DRAWN AEROBIC AND ANAEROBIC Blood Culture results may not be optimal due to an inadequate volume of blood received in culture bottles   Culture   Final    NO GROWTH 5 DAYS Performed at [facility], [place] [address], [place], [place] [address]    Report Status 03/04/2021 FINAL  Final  Blood culture (routine x 2)     Status: None   Collection Time: 02/27/21  5:54 PM   Specimen: Site Not Specified; Blood  Result Value Ref Range Status   Specimen Description SITE NOT SPECIFIED  Final   Special Requests   Final    BOTTLES DRAWN AEROBIC AND ANAEROBIC Blood Culture results may not be optimal due to an excessive volume of blood received in culture bottles   Culture   Final    NO GROWTH 5 DAYS Performed at [facility], [place] [address], [place], [place] [address]    Report Status 03/04/2021 FINAL  Final  Resp Panel by RT-PCR (Flu A&B, Covid) Nasopharyngeal Swab     Status: None   Collection Time: 02/28/21  1:14 AM   Specimen: Nasopharyngeal Swab; Nasopharyngeal(NP) swabs in vial transport medium  Result Value Ref  Range Status   SARS Coronavirus 2 by RT PCR NEGATIVE NEGATIVE Final    Comment: (NOTE) SARS-CoV-2 target nucleic acids are NOT DETECTED.  The SARS-CoV-2 RNA is generally detectable in upper respiratory specimens during the acute phase of infection. The lowest concentration of SARS-CoV-2 viral copies this assay can detect is 138 copies/mL. A negative result does not preclude SARS-Cov-2 infection and should not be used as the sole basis for treatment or other patient management decisions. A negative result may occur with  improper specimen collection/handling, submission of specimen other than nasopharyngeal swab, presence of viral mutation(s) within the areas targeted by this assay, and inadequate number of viral copies(<138 copies/mL). A negative result must be combined with clinical observations, patient history, and epidemiological information. The expected result is Negative.  Fact Sheet for Patients:  EntrepreneurPulse.com.au  Fact Sheet for Healthcare Providers:  IncredibleEmployment.be  This test is no t yet approved or cleared by the [place] FDA and  has been authorized for detection and/or diagnosis of SARS-CoV-2 by FDA under an Emergency Use Authorization (EUA). This EUA will remain  in effect (meaning this test can be used) for the duration of the COVID-19 declaration under Section 564(b)(1) of the Act, 21 U.S.C.section 360bbb-3(b)(1), unless the authorization is terminated  or revoked sooner.       Influenza A by PCR NEGATIVE NEGATIVE Final   Influenza B by PCR NEGATIVE NEGATIVE Final    Comment: (NOTE) The Xpert Xpress SARS-CoV-2/FLU/RSV plus assay is intended as an aid in the diagnosis of influenza from Nasopharyngeal swab specimens and should not be used as a sole basis for treatment. Nasal washings and aspirates are unacceptable for Xpert Xpress SARS-CoV-2/FLU/RSV testing.  Fact Sheet for  Patients: EntrepreneurPulse.com.au  Fact Sheet for Healthcare Providers: IncredibleEmployment.be  This test is not yet approved or cleared by the [place] FDA and has been authorized for detection and/or diagnosis of SARS-CoV-2 by FDA under an Emergency Use Authorization (EUA). This EUA will remain in effect (meaning this test can be used) for the duration of the COVID-19 declaration under  Section 564(b)(1) of the Act, 21 U.S.C. section 360bbb-3(b)(1), unless the authorization is terminated or revoked.  Performed at [facility], [place] [address] [place], [place] [address]   Surgical pcr screen     Status: None   Collection Time: 03/01/21  7:56 PM   Specimen: Nasal Mucosa; Nasal Swab  Result Value Ref Range Status   MRSA, PCR NEGATIVE NEGATIVE Final   Staphylococcus aureus NEGATIVE NEGATIVE Final    Comment: (NOTE) The Xpert SA Assay (FDA approved for NASAL specimens in patients 32 years of age and older), is one component of a comprehensive surveillance program. It is not intended to diagnose infection nor to guide or monitor treatment. Performed at [facility], [place] [address] [place], [place] [address]      Labs: Basic Metabolic Panel: Recent Labs  Lab 02/27/21 1610 03/01/21 0110 03/03/21 0115 03/04/21 0248  NA 130* 132* 131* 133*  K 4.6 4.1 3.7 3.8  CL 94* 99 97* 96*  CO2 _0 GLUCOSE 501* 336* 255* 199*  BUN _1 CREATININE 0.69 0.71 0.65 0.62  CALCIUM 9.2 8.4* 8.4* 8.7*   Liver Function Tests: No results for input(s): AST, ALT, ALKPHOS, BILITOT, PROT, ALBUMIN in the last 168 hours. No results for input(s): LIPASE, AMYLASE in the last 168 hours. No results for input(s): AMMONIA in the last 168 hours. CBC: Recent Labs  Lab 02/27/21 1610 02/28/21 0500 03/01/21 0110 03/03/21 0115 03/04/21 0248  WBC 15.2* 12.5* 12.3* 9.2 9.9  NEUTROABS 12.8*  --   --   --   --   HGB 12.7 11.7* 11.0*  10.4* 11.2*  HCT 37.5 34.7* 33.4* 31.8* 32.7*  MCV 90.8 91.1 91.8 91.4 89.8  PLT 324 294 312 297 363   Cardiac Enzymes: No results for input(s): CKTOTAL, CKMB, CKMBINDEX, TROPONINI in the last 168 hours. BNP: BNP (last 3 results) No results for input(s): BNP in the last 8760 hours.  ProBNP (last 3 results) No results for input(s): PROBNP in the last 8760 hours.  CBG: Recent Labs  Lab 03/03/21 1802 03/03/21 2144 03/04/21 0659 03/04/21 0808 03/04/21 1159  GLUCAP 127* 234* 172* 209* 276*       Signed:  Domenic Polite MD.  [facility] 03/04/2021, 1:27 PM

## 2021-03-12 LAB — GLUCOSE, CAPILLARY
Glucose-Capillary: 284 mg/dL — ABNORMAL HIGH (ref 70–99)
Glucose-Capillary: 313 mg/dL — ABNORMAL HIGH (ref 70–99)
Glucose-Capillary: 330 mg/dL — ABNORMAL HIGH (ref 70–99)

## 2021-03-15 ENCOUNTER — Other Ambulatory Visit: Payer: Self-pay

## 2021-03-15 ENCOUNTER — Telehealth: Payer: Self-pay

## 2021-03-15 ENCOUNTER — Ambulatory Visit (INDEPENDENT_AMBULATORY_CARE_PROVIDER_SITE_OTHER): Payer: Self-pay | Admitting: Orthopedic Surgery

## 2021-03-15 ENCOUNTER — Encounter: Payer: Self-pay | Admitting: Orthopedic Surgery

## 2021-03-15 DIAGNOSIS — Z89421 Acquired absence of other right toe(s): Secondary | ICD-10-CM

## 2021-03-15 NOTE — Progress Notes (Signed)
Office Visit Note   Patient: Alison Gates           Date of Birth: 1975/06/02           MRN: [id number] Visit Date: 03/15/2021              Requested by: No referring provider defined for this encounter. PCP: Pcp, No  Chief Complaint  Patient presents with   Left Foot - Routine Post Op    03/02/21 left GT toe amputation    Right Foot - Routine Post Op    03/02/21 right foot 4th toe amputation       HPI: Patient is a 45 year old woman who presents in follow-up 2 weeks status post left great toe and right fourth toe amputation.  Patient has not filled any of her diabetic medications.  Assessment & Plan: Visit Diagnoses:  1. 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  Follow-up in 1 week to harvest the sutures.  Start Dial soap cleansing and dry dressing changes daily and minimize weightbearing.  Follow-Up Instructions: Return in about 1 week (around 03/22/2021).   Ortho Exam  Patient is alert, oriented, no adenopathy, well-dressed, normal affect, normal respiratory effort. Examination the wounds are well approximated there is no redness cellulitis or drainage no signs of infection.  Patient states she has not been taking her diabetic medication.  Imaging: No results found. No images are attached to the encounter.  Labs: Lab Results  Component Value Date   HGBA1C 10.5 (H) 02/27/2021   HGBA1C 11.0 (H) 01/25/2020   ESRSEDRATE 123 (H) 02/27/2021   ESRSEDRATE 86 (H) 01/27/2020   ESRSEDRATE 96 (H) 01/26/2020   CRP 22.6 (H) 02/27/2021   CRP 4.9 (H) 01/27/2020   CRP 8.6 (H) 01/26/2020   REPTSTATUS 03/04/2021 FINAL 02/27/2021   CULT  02/27/2021    NO GROWTH 5 DAYS Performed at [facility], [address] [place], [place] [address]      Lab Results  Component Value Date   ALBUMIN 3.3 (L) 01/24/2020   PREALBUMIN  12.1 (L) 01/25/2020    No results found for: MG No results found for: VD25OH  Lab Results  Component Value Date   PREALBUMIN 12.1 (L) 01/25/2020   CBC EXTENDED Latest Ref Rng & Units 03/04/2021 03/03/2021 03/01/2021  WBC 4.0 - 10.5 K/uL 9.9 9.2 12.3(H)  RBC 3.87 - 5.11 MIL/uL 3.64(L) 3.48(L) 3.64(L)  HGB 12.0 - 15.0 g/dL 11.2(L) 10.4(L) 11.0(L)  HCT 36.0 - 46.0 % 32.7(L) 31.8(L) 33.4(L)  PLT 150 - 400 K/uL 363 297 312  NEUTROABS 1.7 - 7.7 K/uL - - -  LYMPHSABS 0.7 - 4.0 K/uL - - -     There is no height or weight on file to calculate BMI.  Orders:  No orders of the defined types were placed in this encounter.  No orders of the defined types were placed in this encounter.    Procedures: No procedures performed  Clinical Data: No additional findings.  ROS:  All other systems negative, except as noted in the HPI. Review of Systems  Objective: Vital Signs: There were no vitals taken for this visit.  Specialty Comments:  No specialty comments available.  PMFS History: Patient Active Problem List   Diagnosis Date Noted   Cellulitis 02/28/2021   Sepsis (HCC) 02/28/2021   Peripheral arterial disease (HCC)  02/28/2021   Diabetic foot infection ([place])    Osteomyelitis of fifth toe of right foot ([place]) 01/25/2020   Poorly controlled diabetes mellitus ([place]) 01/25/2020   Past Medical History:  Diagnosis Date   Diabetes ([place]) 10/2018    History reviewed. No pertinent family history.  Past Surgical History:  Procedure Laterality Date   AMPUTATION Right 01/26/2020   Procedure: RIGHT FOOT LITTLE TOE AMPUTATION;  Surgeon: Newt Minion, MD;  Location: [facility];  Service: Orthopedics;  Laterality: Right;   AMPUTATION TOE N/A 03/02/2021   Procedure: AMPUTATION OF LEFT GREAT TOE AND RIGHT FOURTH TOE;  Surgeon: Newt Minion, MD;  Location: [facility];  Service: Orthopedics;  Laterality: N/A;   Social History   Occupational History   Not on file  Tobacco Use   Smoking  status: Never   Smokeless tobacco: Never  Substance and Sexual Activity   Alcohol use: Not Currently   Drug use: Not Currently   Sexual activity: Not Currently

## 2021-03-15 NOTE — Telephone Encounter (Signed)
Megan called and needs conformation for the patients short term disability. She needs to know if patient having toes removed came from being a diabetic or from a injury.   Please advise

## 2021-03-16 NOTE — Telephone Encounter (Signed)
I called and sw Tiffany and advised that this was not n injury per the ER note. Will call back with questions.

## 2021-03-22 ENCOUNTER — Encounter: Payer: Self-pay | Admitting: Orthopedic Surgery

## 2021-03-22 ENCOUNTER — Ambulatory Visit (INDEPENDENT_AMBULATORY_CARE_PROVIDER_SITE_OTHER): Payer: Self-pay | Admitting: Orthopedic Surgery

## 2021-03-22 DIAGNOSIS — Z89412 Acquired absence of left great toe: Secondary | ICD-10-CM

## 2021-03-22 DIAGNOSIS — Z89421 Acquired absence of other right toe(s): Secondary | ICD-10-CM

## 2021-03-22 DIAGNOSIS — S98112A Complete traumatic amputation of left great toe, initial encounter: Secondary | ICD-10-CM

## 2021-03-22 MED ORDER — DOXYCYCLINE HYCLATE 100 MG PO TABS: 100.0000 mg | ORAL_TABLET | Freq: Two times a day (BID) | ORAL | 0 refills | Status: DC

## 2021-03-22 NOTE — Progress Notes (Signed)
Office Visit Note   Patient: Alison Gates           Date of Birth: 1976-03-17           MRN: [id number] Visit Date: 03/22/2021              Requested by: No referring provider defined for this encounter. PCP: Pcp, No  Chief Complaint  Patient presents with   Left Foot - Routine Post Op   Right Foot - Routine Post Op      HPI: Patient is almost 3 weeks out from right foot fourth toe amputation of the left foot great toe amputation.  Patient is seen with an interpreter and family.  Assessment & Plan: Visit Diagnoses:  1. 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 Amputation of left great toe (HCC)     Plan: We will harvest the sutures today.  There is some redness on the plantar aspect beneath the metatarsal head and we will start her on doxycycline.  Advance weightbearing as tolerated.  Follow-Up Instructions: Return in about 2 weeks (around 04/05/2021).   Ortho Exam  Patient is alert, oriented, no adenopathy, well-dressed, normal affect, normal respiratory effort. Examination incisions are well-healed there is no redness no cellulitis no drainage no signs of infection around the incision.  Patient does have some redness proximally along the metatarsal on the plantar aspect the left foot.  This is tenderness to palpation and we will start her on doxycycline for this.  Imaging: No results found. No images are attached to the encounter.  Labs: Lab Results  Component Value Date   HGBA1C 10.5 (H) 02/27/2021   HGBA1C 11.0 (H) 01/25/2020   ESRSEDRATE 123 (H) 02/27/2021   ESRSEDRATE 86 (H) 01/27/2020   ESRSEDRATE 96 (H) 01/26/2020   CRP 22.6 (H) 02/27/2021   CRP 4.9 (H) 01/27/2020   CRP 8.6 (H) 01/26/2020   REPTSTATUS 03/04/2021 FINAL 02/27/2021   CULT  02/27/2021    NO GROWTH 5 DAYS Performed at [facility], [address] [place], [place] [address]      Lab Results  Component Value Date   ALBUMIN 3.3 (L) 01/24/2020    PREALBUMIN 12.1 (L) 01/25/2020    No results found for: MG No results found for: VD25OH  Lab Results  Component Value Date   PREALBUMIN 12.1 (L) 01/25/2020   CBC EXTENDED Latest Ref Rng & Units 03/04/2021 03/03/2021 03/01/2021  WBC 4.0 - 10.5 K/uL 9.9 9.2 12.3(H)  RBC 3.87 - 5.11 MIL/uL 3.64(L) 3.48(L) 3.64(L)  HGB 12.0 - 15.0 g/dL 11.2(L) 10.4(L) 11.0(L)  HCT 36.0 - 46.0 % 32.7(L) 31.8(L) 33.4(L)  PLT 150 - 400 K/uL 363 297 312  NEUTROABS 1.7 - 7.7 K/uL - - -  LYMPHSABS 0.7 - 4.0 K/uL - - -     There is no height or weight on file to calculate BMI.  Orders:  No orders of the defined types were placed in this encounter.  Meds ordered this encounter  Medications   doxycycline (VIBRA-TABS) 100 MG tablet    Sig: Take 1 tablet (100 mg total) by mouth 2 (two) times daily.    Dispense:  30 tablet    Refill:  0     Procedures: No procedures performed  Clinical Data: No additional findings.  ROS:  All other systems negative, except as noted in the HPI. Review of Systems  Objective: Vital Signs: There were no vitals taken for this  visit.  Specialty Comments:  No specialty comments available.  PMFS History: Patient Active Problem List   Diagnosis Date Noted   Cellulitis 02/28/2021   Sepsis ([place]) 02/28/2021   Peripheral arterial disease ([place]) 02/28/2021   Diabetic foot infection ([place])    Osteomyelitis of fifth toe of right foot ([place]) 01/25/2020   Poorly controlled diabetes mellitus ([place]) 01/25/2020   Past Medical History:  Diagnosis Date   Diabetes ([place]) 10/2018    History reviewed. No pertinent family history.  Past Surgical History:  Procedure Laterality Date   AMPUTATION Right 01/26/2020   Procedure: RIGHT FOOT LITTLE TOE AMPUTATION;  Surgeon: Newt Minion, MD;  Location: [place];  Service: Orthopedics;  Laterality: Right;   AMPUTATION TOE N/A 03/02/2021   Procedure: AMPUTATION OF LEFT GREAT TOE AND RIGHT FOURTH TOE;  Surgeon: Newt Minion, MD;   Location: [place];  Service: Orthopedics;  Laterality: N/A;   Social History   Occupational History   Not on file  Tobacco Use   Smoking status: Never   Smokeless tobacco: Never  Substance and Sexual Activity   Alcohol use: Not Currently   Drug use: Not Currently   Sexual activity: Not Currently

## 2021-04-05 ENCOUNTER — Ambulatory Visit (INDEPENDENT_AMBULATORY_CARE_PROVIDER_SITE_OTHER): Payer: Self-pay | Admitting: Orthopedic Surgery

## 2021-04-05 ENCOUNTER — Other Ambulatory Visit: Payer: Self-pay

## 2021-04-05 DIAGNOSIS — Z89421 Acquired absence of other right toe(s): Secondary | ICD-10-CM

## 2021-04-05 DIAGNOSIS — S98112A Complete traumatic amputation of left great toe, initial encounter: Secondary | ICD-10-CM

## 2021-04-10 ENCOUNTER — Encounter: Payer: Self-pay | Admitting: Orthopedic Surgery

## 2021-04-10 NOTE — Progress Notes (Signed)
Office Visit Note   Patient: Alison Gates           Date of Birth: June 18, 1975           MRN: [id number] Visit Date: 04/05/2021              Requested by: No referring provider defined for this encounter. PCP: Pcp, No  Chief Complaint  Patient presents with   Right Foot - Routine Post Op    Right 4th toe amputation 03/02/21   Left Foot - Routine Post Op    Left Great toe amputation 03/02/21      HPI: Patient is a 46 year old woman who presents status post left great toe amputation and right fourth toe amputation.  Patient was prescribed doxycycline but she states she never got the prescription because the pharmacy did not have it.  Assessment & Plan: Visit Diagnoses:  1. Amputation of left great toe ([place])   2. 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  Follow-Up Instructions: Return in about 4 weeks (around 05/03/2021).   Ortho Exam  Patient is alert, oriented, no adenopathy, well-dressed, normal affect, normal respiratory effort. Examination the right fourth toe amputation is completely healed left foot great toe amputation shows granulation tissue 30 x 3 mm without cellulitis.  Imaging: No results found. No images are attached to the encounter.  Labs: Lab Results  Component Value Date   HGBA1C 10.5 (H) 02/27/2021   HGBA1C 11.0 (H) 01/25/2020   ESRSEDRATE 123 (H) 02/27/2021   ESRSEDRATE 86 (H) 01/27/2020   ESRSEDRATE 96 (H) 01/26/2020   CRP 22.6 (H) 02/27/2021   CRP 4.9 (H) 01/27/2020   CRP 8.6 (H) 01/26/2020   REPTSTATUS 03/04/2021 FINAL 02/27/2021   CULT  02/27/2021    NO GROWTH 5 DAYS Performed at [facility], [place] [address] [place],  [address]      Lab Results  Component Value Date   ALBUMIN 3.3 (L) 01/24/2020   PREALBUMIN 12.1 (L) 01/25/2020    No results found for: MG No  results found for: VD25OH  Lab Results  Component Value Date   PREALBUMIN 12.1 (L) 01/25/2020   CBC EXTENDED Latest Ref Rng & Units 03/04/2021 03/03/2021 03/01/2021  WBC 4.0 - 10.5 K/uL 9.9 9.2 12.3(H)  RBC 3.87 - 5.11 MIL/uL 3.64(L) 3.48(L) 3.64(L)  HGB 12.0 - 15.0 g/dL 11.2(L) 10.4(L) 11.0(L)  HCT 36.0 - 46.0 % 32.7(L) 31.8(L) 33.4(L)  PLT 150 - 400 K/uL 363 297 312  NEUTROABS 1.7 - 7.7 K/uL - - -  LYMPHSABS 0.7 - 4.0 K/uL - - -     There is no height or weight on file to calculate BMI.  Orders:  No orders of the defined types were placed in this encounter.  No orders of the defined types were placed in this encounter.    Procedures: No procedures performed  Clinical Data: No additional findings.  ROS:  All other systems negative, except as noted in the HPI. Review of Systems  Objective: Vital Signs: There were no vitals taken for this visit.  Specialty Comments:  No specialty comments available.  PMFS History: Patient Active Problem List   Diagnosis Date Noted   Cellulitis 02/28/2021   Sepsis ([place]) 02/28/2021   Peripheral arterial disease ([place]) 02/28/2021   Diabetic foot infection ([place])  Osteomyelitis of fifth toe of right foot ([place]) 01/25/2020   Poorly controlled diabetes mellitus ([place]) 01/25/2020   Past Medical History:  Diagnosis Date   Diabetes ([place]) 10/2018    History reviewed. No pertinent family history.  Past Surgical History:  Procedure Laterality Date   AMPUTATION Right 01/26/2020   Procedure: RIGHT FOOT LITTLE TOE AMPUTATION;  Surgeon: Newt Minion, MD;  Location: [facility];  Service: Orthopedics;  Laterality: Right;   AMPUTATION TOE N/A 03/02/2021   Procedure: AMPUTATION OF LEFT GREAT TOE AND RIGHT FOURTH TOE;  Surgeon: Newt Minion, MD;  Location: [place] [facility];  Service: Orthopedics;  Laterality: N/A;   Social History   Occupational History   Not on file  Tobacco Use   Smoking status: Never   Smokeless tobacco: Never  Substance and  Sexual Activity   Alcohol use: Not Currently   Drug use: Not Currently   Sexual activity: Not Currently

## 2021-04-26 ENCOUNTER — Ambulatory Visit (INDEPENDENT_AMBULATORY_CARE_PROVIDER_SITE_OTHER): Payer: Self-pay | Admitting: Orthopedic Surgery

## 2021-04-26 ENCOUNTER — Other Ambulatory Visit: Payer: Self-pay

## 2021-04-26 DIAGNOSIS — Z89421 Acquired absence of other right toe(s): Secondary | ICD-10-CM

## 2021-04-26 DIAGNOSIS — Z89412 Acquired absence of left great toe: Secondary | ICD-10-CM

## 2021-04-26 DIAGNOSIS — S98112A Complete traumatic amputation of left great toe, initial encounter: Secondary | ICD-10-CM

## 2021-05-01 ENCOUNTER — Other Ambulatory Visit: Payer: Self-pay

## 2021-05-13 ENCOUNTER — Encounter: Payer: Self-pay | Admitting: Orthopedic Surgery

## 2021-05-13 NOTE — Progress Notes (Signed)
Office Visit Note   Patient: Alison Gates           Date of Birth: [birth year]           MRN: [id number] Visit Date: 04/26/2021              Requested by: No referring provider defined for this encounter. PCP: Pcp, No  Chief Complaint  Patient presents with   Left Foot - Follow-up   Right Foot - Follow-up      HPI: Patient is a 46 year old woman who is seen in follow-up 2 months status post left great toe and right fourth ray amputation.  Patient states she took some [place] antibiotics and feels better.  Assessment & Plan: Visit Diagnoses:  1. Amputation of left great toe (HCC)   2. 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  Follow-Up Instructions: Return in about 3 weeks (around 05/17/2021).   Ortho Exam  Patient is alert, oriented, no adenopathy, well-dressed, normal affect, normal respiratory effort. Examination there is a 3 mm diameter open area of granulation tissue x3 these were touched with silver nitrate there is no cellulitis odor or drainage.  Imaging: No results found. No images are attached to the encounter.  Labs: Lab Results  Component Value Date   HGBA1C 10.5 (H) 02/27/2021   HGBA1C 11.0 (H) 01/25/2020   ESRSEDRATE 123 (H) 02/27/2021   ESRSEDRATE 86 (H) 01/27/2020   ESRSEDRATE 96 (H) 01/26/2020   CRP 22.6 (H) 02/27/2021   CRP 4.9 (H) 01/27/2020   CRP 8.6 (H) 01/26/2020   REPTSTATUS 03/04/2021 FINAL 02/27/2021   CULT  02/27/2021    NO GROWTH 5 DAYS Performed at [facility], [address] [place], [place] [address]      Lab Results  Component Value Date   ALBUMIN 3.3 (L) 01/24/2020   PREALBUMIN 12.1 (L) 01/25/2020    No results found for: MG No results found for: VD25OH  Lab Results  Component Value Date   PREALBUMIN 12.1 (L) 01/25/2020   CBC EXTENDED Latest Ref Rng & Units 03/04/2021 03/03/2021 03/01/2021  WBC 4.0 - 10.5 K/uL 9.9 9.2  12.3(H)  RBC 3.87 - 5.11 MIL/uL 3.64(L) 3.48(L) 3.64(L)  HGB 12.0 - 15.0 g/dL 11.2(L) 10.4(L) 11.0(L)  HCT 36.0 - 46.0 % 32.7(L) 31.8(L) 33.4(L)  PLT 150 - 400 K/uL 363 297 312  NEUTROABS 1.7 - 7.7 K/uL - - -  LYMPHSABS 0.7 - 4.0 K/uL - - -     There is no height or weight on file to calculate BMI.  Orders:  No orders of the defined types were placed in this encounter.  No orders of the defined types were placed in this encounter.    Procedures: No procedures performed  Clinical Data: No additional findings.  ROS:  All other systems negative, except as noted in the HPI. Review of Systems  Objective: Vital Signs: There were no vitals taken for this visit.  Specialty Comments:  No specialty comments available.  PMFS History: Patient Active Problem List   Diagnosis Date Noted   Cellulitis 02/28/2021   Sepsis (HCC) 02/28/2021   Peripheral arterial disease (HCC) 02/28/2021   Diabetic foot infection (HCC)    Osteomyelitis of fifth toe of right foot (HCC) 01/25/2020   Poorly controlled diabetes mellitus (HCC) 01/25/2020   Past Medical History:  Diagnosis Date   Diabetes (HCC) 10/2018  History reviewed. No pertinent family history.  Past Surgical History:  Procedure Laterality Date   AMPUTATION Right 01/26/2020   Procedure: RIGHT FOOT LITTLE TOE AMPUTATION;  Surgeon: Newt Minion, MD;  Location: [place];  Service: Orthopedics;  Laterality: Right;   AMPUTATION TOE N/A 03/02/2021   Procedure: AMPUTATION OF LEFT GREAT TOE AND RIGHT FOURTH TOE;  Surgeon: Newt Minion, MD;  Location: [facility];  Service: Orthopedics;  Laterality: N/A;   Social History   Occupational History   Not on file  Tobacco Use   Smoking status: Never   Smokeless tobacco: Never  Substance and Sexual Activity   Alcohol use: Not Currently   Drug use: Not Currently   Sexual activity: Not Currently

## 2021-05-17 ENCOUNTER — Other Ambulatory Visit: Payer: Self-pay

## 2021-05-17 ENCOUNTER — Encounter: Payer: Self-pay | Admitting: Orthopedic Surgery

## 2021-05-17 ENCOUNTER — Ambulatory Visit (INDEPENDENT_AMBULATORY_CARE_PROVIDER_SITE_OTHER): Payer: 59 | Admitting: Orthopedic Surgery

## 2021-05-17 DIAGNOSIS — S98112A Complete traumatic amputation of left great toe, initial encounter: Secondary | ICD-10-CM

## 2021-05-17 DIAGNOSIS — Z89412 Acquired absence of left great toe: Secondary | ICD-10-CM

## 2021-05-17 DIAGNOSIS — Z89421 Acquired absence of other right toe(s): Secondary | ICD-10-CM

## 2021-05-17 NOTE — Progress Notes (Signed)
Office Visit Note   Patient: Alison Gates           Date of Birth: 1975-08-03           MRN: [id number] Visit Date: 05/17/2021              Requested by: No referring provider defined for this encounter. PCP: Pcp, No  Chief Complaint  Patient presents with   Left Foot - Routine Post Op    03/02/21 left GT and 4th toe amputation       HPI: Patient is a 46 year old woman who is follow-up status post left great toe and right fourth toe amputation.  She is currently wearing crocs sneakers she states she occasionally has phantom pain of the great toe.  Patient states her blood sugars run generally under 200.  Assessment & Plan: Visit Diagnoses:  1. Amputation of left great toe ([place])   2. 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  Follow-Up Instructions: Return if symptoms worsen or fail to improve.   Ortho Exam  Patient is alert, oriented, no adenopathy, well-dressed, normal affect, normal respiratory effort. Examination patient has good pulses and dorsiflexion of the ankle bilaterally.  There are no ulcers the incisions have healed well patient did have a plastic [place] stuck in the plantar aspect of her foot this was removed there were no deep ulcers.  Imaging: No results found. No images are attached to the encounter.  Labs: Lab Results  Component Value Date   HGBA1C 10.5 (H) 02/27/2021   HGBA1C 11.0 (H) 01/25/2020   ESRSEDRATE 123 (H) 02/27/2021   ESRSEDRATE 86 (H) 01/27/2020   ESRSEDRATE 96 (H) 01/26/2020   CRP 22.6 (H) 02/27/2021   CRP 4.9 (H) 01/27/2020   CRP 8.6 (H) 01/26/2020   REPTSTATUS 03/04/2021 FINAL 02/27/2021   CULT  02/27/2021    NO GROWTH 5 DAYS Performed at [facility], [place] [address], [place], [place] [address]      Lab Results  Component Value Date   ALBUMIN 3.3 (L) 01/24/2020   PREALBUMIN 12.1 (L)  01/25/2020    No results found for: MG No results found for: VD25OH  Lab Results  Component Value Date   PREALBUMIN 12.1 (L) 01/25/2020   CBC EXTENDED Latest Ref Rng & Units 03/04/2021 03/03/2021 03/01/2021  WBC 4.0 - 10.5 K/uL 9.9 9.2 12.3(H)  RBC 3.87 - 5.11 MIL/uL 3.64(L) 3.48(L) 3.64(L)  HGB 12.0 - 15.0 g/dL 11.2(L) 10.4(L) 11.0(L)  HCT 36.0 - 46.0 % 32.7(L) 31.8(L) 33.4(L)  PLT 150 - 400 K/uL 363 297 312  NEUTROABS 1.7 - 7.7 K/uL - - -  LYMPHSABS 0.7 - 4.0 K/uL - - -     There is no height or weight on file to calculate BMI.  Orders:  No orders of the defined types were placed in this encounter.  No orders of the defined types were placed in this encounter.    Procedures: No procedures performed  Clinical Data: No additional findings.  ROS:  All other systems negative, except as noted in the HPI. Review of Systems  Objective: Vital Signs: There were no vitals taken for this visit.  Specialty Comments:  No specialty comments available.  PMFS History: Patient Active Problem List   Diagnosis Date Noted   Cellulitis 02/28/2021   Sepsis ([place]) 02/28/2021   Peripheral  arterial disease ([place]) 02/28/2021   Diabetic foot infection ([place])    Osteomyelitis of fifth toe of right foot ([place]) 01/25/2020   Poorly controlled diabetes mellitus ([place]) 01/25/2020   Past Medical History:  Diagnosis Date   Diabetes ([place]) 10/2018    History reviewed. No pertinent family history.  Past Surgical History:  Procedure Laterality Date   AMPUTATION Right 01/26/2020   Procedure: RIGHT FOOT LITTLE TOE AMPUTATION;  Surgeon: Newt Minion, MD;  Location: [place] [facility];  Service: Orthopedics;  Laterality: Right;   AMPUTATION TOE N/A 03/02/2021   Procedure: AMPUTATION OF LEFT GREAT TOE AND RIGHT FOURTH TOE;  Surgeon: Newt Minion, MD;  Location: [facility];  Service: Orthopedics;  Laterality: N/A;   Social History   Occupational History   Not on file  Tobacco Use   Smoking status: Never    Smokeless tobacco: Never  Substance and Sexual Activity   Alcohol use: Not Currently   Drug use: Not Currently   Sexual activity: Not Currently

## 2021-10-15 ENCOUNTER — Other Ambulatory Visit (HOSPITAL_BASED_OUTPATIENT_CLINIC_OR_DEPARTMENT_OTHER): Payer: Self-pay

## 2022-04-23 ENCOUNTER — Other Ambulatory Visit: Payer: Self-pay | Admitting: Family Medicine

## 2022-04-23 DIAGNOSIS — Z1231 Encounter for screening mammogram for malignant neoplasm of breast: Secondary | ICD-10-CM

## 2022-05-09 ENCOUNTER — Ambulatory Visit
Admission: RE | Admit: 2022-05-09 | Discharge: 2022-05-09 | Disposition: A | Discharge status: Home / Self Care | Payer: BC Managed Care – PPO | Source: Ambulatory Visit | Attending: Family Medicine | Admitting: Family Medicine

## 2022-05-09 DIAGNOSIS — Z1231 Encounter for screening mammogram for malignant neoplasm of breast: Secondary | ICD-10-CM

## 2022-05-14 ENCOUNTER — Other Ambulatory Visit: Payer: Self-pay | Admitting: Family Medicine

## 2022-05-14 DIAGNOSIS — R928 Other abnormal and inconclusive findings on diagnostic imaging of breast: Secondary | ICD-10-CM

## 2022-05-30 ENCOUNTER — Encounter: Payer: Self-pay | Admitting: Nurse Practitioner

## 2022-06-12 ENCOUNTER — Ambulatory Visit: Payer: BC Managed Care – PPO

## 2022-06-12 ENCOUNTER — Ambulatory Visit
Admission: RE | Admit: 2022-06-12 | Discharge: 2022-06-12 | Disposition: A | Discharge status: Home / Self Care | Payer: BC Managed Care – PPO | Source: Ambulatory Visit | Attending: Family Medicine | Admitting: Family Medicine

## 2022-06-12 DIAGNOSIS — R928 Other abnormal and inconclusive findings on diagnostic imaging of breast: Secondary | ICD-10-CM

## 2022-08-06 ENCOUNTER — Encounter: Payer: Self-pay | Admitting: Nurse Practitioner

## 2022-08-06 ENCOUNTER — Ambulatory Visit (INDEPENDENT_AMBULATORY_CARE_PROVIDER_SITE_OTHER): Payer: BC Managed Care – PPO | Admitting: Nurse Practitioner

## 2022-08-06 VITALS — BP 134/80 | HR 81 | Ht 62.0 in | Wt 183.0 lb

## 2022-08-06 DIAGNOSIS — Z1211 Encounter for screening for malignant neoplasm of colon: Secondary | ICD-10-CM | POA: Diagnosis not present

## 2022-08-06 DIAGNOSIS — E139 Other specified diabetes mellitus without complications: Secondary | ICD-10-CM

## 2022-08-06 MED ORDER — NA SULFATE-K SULFATE-MG SULF 17.5-3.13-1.6 GM/177ML PO SOLN: 1.0000 | Freq: Once | ORAL | 0 refills | Status: AC

## 2022-08-06 NOTE — Progress Notes (Signed)
08/06/2022 Alison Gates [id number] 09/08/1975   CHIEF COMPLAINT: Schedule a colonoscopy   HISTORY OF PRESENT ILLNESS: Alison Gates is a 47 year old female with a past medical history of diabetes an osteomyelitis s/p right 5th toe amputation 01/2020 and s/p left great toe and right 4th toe amputation 02/2021. Past C section. She presents to our office today as referred by Ward Chatters NP to schedule a screening colonoscopy.  She speaks Spanish therefore she presents with a [place] Spanish interpreter to facilitate communication throughout today's office visit.  She denies having any abdominal pain but infrequently experiences abdominal bloat.  She passes a normal formed brown bowel movement daily.  No rectal bleeding or black stools.  No nausea or vomiting.  No dysphagia or GERD symptoms.  No known family history of colon polyps or colorectal cancer.  She has not seen a gynecologist within the past year.   Past Medical History:  Diagnosis Date   Diabetes (HCC) 10/2018   Past Surgical History:  Procedure Laterality Date   AMPUTATION Right 01/26/2020   Procedure: RIGHT FOOT LITTLE TOE AMPUTATION;  Surgeon: [name], MD;  [facility];  Service: Orthopedics;  Laterality: Right;   AMPUTATION TOE N/A 03/02/2021   Procedure: AMPUTATION OF LEFT GREAT TOE AND RIGHT FOURTH TOE;  Surgeon: [name], MD;  Location: [facility];  Service: Orthopedics;  Laterality: N/A;   CESAREAN SECTION     Social History: She is single. She has 1 son and 2 daughters.  He works in a Acup[employer].  Non-smoker.  Infrequent alcohol intake.  Family History:  Mother with history of diabetes.  No known family history of esophageal, gastric or colorectal cancer.  No Known Allergies    Outpatient Encounter Medications as of 08/06/2022  Medication Sig   doxycycline (VIBRA-TABS) 100 MG tablet Take 1 tablet (100 mg total) by mouth 2 (two) times daily.   glyBURIDE-metformin (GLUCOVANCE) 5-500 MG tablet  Take 1 tablet by mouth 2 (two) times daily.   ibuprofen (ADVIL) 200 MG tablet Take 400 mg by mouth every 6 (six) hours as needed for fever, headache or mild pain.   insulin isophane & regular human KwikPen (NOVOLIN 70/30 KWIKPEN) (70-30) 100 UNIT/ML KwikPen Inject 25 Units into the skin 2 (two) times daily with a meal.   Insulin Pen Needle 32G X 4 MM MISC 20 Units by Does not apply route 2 (two) times daily.   OZEMPIC, 0.25 OR 0.5 MG/DOSE, 2 MG/1.5ML SOPN Inject 0.25 mg into the skin once a week.   vitamin B-12 (CYANOCOBALAMIN) 500 MCG tablet Take 500 mcg by mouth daily.   acetaminophen (TYLENOL) 325 MG tablet Take 1-2 tablets (325-650 mg total) by mouth every 6 (six) hours as needed for mild pain (pain score 1-3 or temp > 100.5). (Patient not taking: Reported on 08/06/2022)   No facility-administered encounter medications on file as of 08/06/2022.    REVIEW OF SYSTEMS:  Gen: Denies fever, sweats or chills. No weight loss.  CV: Denies chest pain, palpitations or edema. Resp: Denies cough, shortness of breath of hemoptysis.  GI:See HPI. No GERD symptoms.  GU : Denies urinary burning, blood in urine, increased urinary frequency or incontinence. MS: Denies joint pain, muscles aches or weakness. Derm: Denies rash, itchiness, skin lesions or unhealing ulcers. Psych: Denies depression, anxiety, memory loss or confusion. Heme: Denies bruising, easy bleeding. Neuro:  Denies headaches, dizziness or paresthesias. Endo:  Denies any problems with DM, thyroid or adrenal function.  PHYSICAL EXAM: BP 134/80   Pulse 81   Ht 5\' 2"  (1.575 m)   Wt 183 lb (83 kg)   BMI 33.47 kg/m  General: 47 year old female in no acute distress. Head: Normocephalic and atraumatic. Eyes:  Sclerae non-icteric, conjunctive pink. Ears: Normal auditory acuity. Mouth: Dentition intact. No ulcers or lesions.  Neck: Supple, no lymphadenopathy or thyromegaly.  Lungs: Clear bilaterally to auscultation without wheezes, crackles  or rhonchi. Heart: Regular rate and rhythm. No murmur, rub or gallop appreciated.  Abdomen: Soft, nontender, nondistended. No masses. No hepatosplenomegaly. Normoactive bowel sounds x 4 quadrants.  Rectal: Deferred. Musculoskeletal: Symmetrical with no gross deformities. Skin: Warm and dry. No rash or lesions on visible extremities. Extremities: No edema. Neurological: Alert oriented x 4, no focal deficits.  Psychological:  Alert and cooperative. Normal mood and affect.  ASSESSMENT AND PLAN:  47 year old female presents to schedule screening colonoscopy -Colonoscopy benefits and risks discussed including risk with sedation, risk of bleeding, perforation and infection  -Patient instructed to hold Ozempic for 1 week prior to her colonoscopy date -Further recommendations to be determined after colonoscopy completed  Abdominal bloat, infrequent -Recommend GYN evaluation -Recommend GI follow-up if abdominal bloat recurs/persists      CC:  No ref. provider found

## 2022-08-06 NOTE — Patient Instructions (Addendum)
NO TOME OZEMPIC DURANTE UNA SEMANA ANTES DE LA FECHA DE SU COLONOSCOPIA.  Le han programado una colonoscopia. Siga las instrucciones escritas que se le dieron en su visita de hoy. Recoja sus suministros de preparacin en la farmacia dentro de los prximos 1 a 3 das. Si [place] inhaladores (incluso solo cuando sea necesario), trigalos con usted el da de su procedimiento.  Due to recent changes in healthcare laws, you may see the results of your imaging and laboratory studies on MyChart before your provider has had a chance to review them.  We understand that in some cases there may be results that are confusing or concerning to you. Not all laboratory results come back in the same time frame and the provider may be waiting for multiple results in order to interpret others.  Please give [place] 48 hours in order for your provider to thoroughly review all the results before contacting the office for clarification of your results.   Thank you for trusting me with your gastrointestinal care!   Alcide Evener, CRNP

## 2022-08-07 ENCOUNTER — Encounter: Payer: Self-pay | Admitting: Gastroenterology

## 2022-08-12 NOTE — Progress Notes (Signed)
Dr. Tomasa Rand, your are correct. Further review of her records in care everywhere showed a positive H. Pylori breath test 11/2021, treated with Amox/Clarith and PPI x 14 days. Post tx H. Pylori stool antigen negative. Do you want me to reorder H. Pylori stool test now or do you prefer to do an EGD to assess her stomach and bx for H. Pylori at the time of her colonoscopy?

## 2022-08-12 NOTE — Progress Notes (Signed)
Correction post H. Pylori treatment (breath test, not stool test) 01/2022 was negative.

## 2022-08-12 NOTE — Progress Notes (Signed)
Agree with the assessment and plan as outlined by Alcide Evener, NP.  Would also consider checking H. Pylori antigen if not done already, as this can be a common cause of bloating.   Alison Gates E. Tomasa Rand, MD [facility]

## 2022-08-13 ENCOUNTER — Encounter: Payer: Self-pay | Admitting: Gastroenterology

## 2022-08-13 ENCOUNTER — Ambulatory Visit (AMBULATORY_SURGERY_CENTER): Payer: BC Managed Care – PPO | Admitting: Gastroenterology

## 2022-08-13 VITALS — BP 106/64 | HR 80 | Temp 96.8°F | Resp 13 | Ht 62.0 in | Wt 183.0 lb

## 2022-08-13 DIAGNOSIS — D123 Benign neoplasm of transverse colon: Secondary | ICD-10-CM | POA: Diagnosis not present

## 2022-08-13 DIAGNOSIS — Z1211 Encounter for screening for malignant neoplasm of colon: Secondary | ICD-10-CM | POA: Diagnosis present

## 2022-08-13 MED ORDER — SODIUM CHLORIDE 0.9 % IV SOLN: 500.0000 mL | Freq: Once | INTRAVENOUS | Status: DC

## 2022-08-13 NOTE — Patient Instructions (Signed)
Handout on polyps given to patient. Await pathology results. Resume previous diet and continue present medications.  Repeat colonoscopy for surveillance will be determined based off of pathology results.   YOU HAD AN ENDOSCOPIC PROCEDURE TODAY AT THE [facility]:   Refer to the procedure report that was given to you for any specific questions about what was found during the examination.  If the procedure report does not answer your questions, please call your gastroenterologist to clarify.  If you requested that your care partner not be given the details of your procedure findings, then the procedure report has been included in a sealed envelope for you to review at your convenience later.  YOU SHOULD EXPECT: Some feelings of bloating in the abdomen. Passage of more gas than usual.  Walking can help get rid of the air that was put into your GI tract during the procedure and reduce the bloating. If you had a lower endoscopy (such as a colonoscopy or flexible sigmoidoscopy) you may notice spotting of blood in your stool or on the toilet paper. If you underwent a bowel prep for your procedure, you may not have a normal bowel movement for a few days.  Please Note:  You might notice some irritation and congestion in your nose or some drainage.  This is from the oxygen used during your procedure.  There is no need for concern and it should clear up in a day or so.  SYMPTOMS TO REPORT IMMEDIATELY:  Following lower endoscopy (colonoscopy or flexible sigmoidoscopy):  Excessive amounts of blood in the stool  Significant tenderness or worsening of abdominal pains  Swelling of the abdomen that is new, acute  Fever of 100F or higher  For urgent or emergent issues, a gastroenterologist can be reached at any hour by calling [phone]. Do not use MyChart messaging for urgent concerns.    DIET:  We do recommend a small meal at first, but then you may proceed to your regular diet.  Drink  plenty of fluids but you should avoid alcoholic beverages for 24 hours.  ACTIVITY:  You should plan to take it easy for the rest of today and you should NOT DRIVE or use heavy machinery until tomorrow (because of the sedation medicines used during the test).    FOLLOW UP: Our staff will call the number listed on your records the next business day following your procedure.  We will call around 7:15- 8:00 am to check on you and address any questions or concerns that you may have regarding the information given to you following your procedure. If we do not reach you, we will leave a message.     If any biopsies were taken you will be contacted by phone or by letter within the next 1-3 weeks.  Please call us at [phone] if you have not heard about the biopsies in 3 weeks.    SIGNATURES/CONFIDENTIALITY: You and/or your care partner have signed paperwork which will be entered into your electronic medical record.  These signatures attest to the fact that that the information above on your After Visit Summary has been reviewed and is understood.  Full responsibility of the confidentiality of this discharge information lies with you and/or your care-partner. 

## 2022-08-13 NOTE — Op Note (Signed)
[facility] Patient Name: Alison Gates Procedure Date: 08/13/2022 8:04 AM MRN: [id number] Endoscopist: Lorin Picket E. Tomasa Rand , MD, [id number] Age: 47 Referring MD:  Date of Birth: Aug 10, 1975 Gender: Female Account #: [id number] Procedure:                Colonoscopy Indications:              Screening for colorectal malignant neoplasm, This                            is the patient's first colonoscopy Medicines:                Monitored Anesthesia Care Procedure:                Pre-Anesthesia Assessment:                           - Prior to the procedure, a History and Physical                            was performed, and patient medications and                            allergies were reviewed. The patient's tolerance of                            previous anesthesia was also reviewed. The risks                            and benefits of the procedure and the sedation                            options and risks were discussed with the patient.                            All questions were answered, and informed consent                            was obtained. Prior Anticoagulants: The patient has                            taken no anticoagulant or antiplatelet agents. ASA                            Grade Assessment: II - A patient with mild systemic                            disease. After reviewing the risks and benefits,                            the patient was deemed in satisfactory condition to                            undergo the procedure.  After obtaining informed consent, the colonoscope                            was passed under direct vision. Throughout the                            procedure, the patient's blood pressure, pulse, and                            oxygen saturations were monitored continuously. The                            CF [id number] #[id number] was introduced through the anus                            and advanced to  the the terminal ileum, with                            identification of the appendiceal orifice and IC                            valve. The colonoscopy was performed without                            difficulty. The patient tolerated the procedure                            well. The quality of the bowel preparation was                            good. The terminal ileum, ileocecal valve,                            appendiceal orifice, and rectum were photographed.                            The bowel preparation used was SUPREP via split                            dose instruction. Scope In: 8:29:20 AM Scope Out: 8:45:04 AM Scope Withdrawal Time: 0 hours 11 minutes 7 seconds  Total Procedure Duration: 0 hours 15 minutes 44 seconds  Findings:                 The perianal and digital rectal examinations were                            normal. Pertinent negatives include normal                            sphincter tone and no palpable rectal lesions.                           A 2 mm polyp was found in the transverse colon. The  polyp was sessile. The polyp was removed with a                            cold snare. Resection and retrieval were complete.                            Estimated blood loss was minimal.                           The exam was otherwise normal throughout the                            examined colon.                           The terminal ileum appeared normal.                           The retroflexed view of the distal rectum and anal                            verge was normal and showed no anal or rectal                            abnormalities. Complications:            No immediate complications. Estimated Blood Loss:     Estimated blood loss was minimal. Impression:               - One 2 mm polyp in the transverse colon, removed                            with a cold snare. Resected and retrieved.                           - The  examined portion of the ileum was normal.                           - The distal rectum and anal verge are normal on                            retroflexion view. Recommendation:           - Patient has a contact number available for                            emergencies. The signs and symptoms of potential                            delayed complications were discussed with the                            patient. Return to normal activities tomorrow.  Written discharge instructions were provided to the                            patient.                           - Resume previous diet.                           - Continue present medications.                           - Await pathology results.                           - Repeat colonoscopy (date not yet determined) for                            surveillance based on pathology results. Ianna Salmela E. Tomasa Rand, MD 08/13/2022 8:49:38 AM This report has been signed electronically.

## 2022-08-13 NOTE — Progress Notes (Signed)
Uneventful anesthetic. Report to pacu rn. Vss. Care resumed by rn. 

## 2022-08-13 NOTE — Progress Notes (Signed)
Called to room to assist during endoscopic procedure.  Patient ID and intended procedure confirmed with present staff. Received instructions for my participation in the procedure from the performing physician.  

## 2022-08-13 NOTE — Progress Notes (Signed)
History and Physical Interval Note:  08/13/2022 8:15 AM  Alison Gates  has presented today for endoscopic procedure(s), with the diagnosis of  Encounter Diagnosis  Name Primary?   Colon cancer screening Yes  .  The various methods of evaluation and treatment have been discussed with the patient and/or family. After consideration of risks, benefits and other options for treatment, the patient has consented to  the endoscopic procedure(s).   The patient's history has been reviewed, patient examined, no change in status, stable for endoscopic procedure(s).  I have reviewed the patient's chart and labs.  Questions were answered to the patient's satisfaction.     Haven Foss E. Tomasa Rand, MD [facility]

## 2022-08-14 ENCOUNTER — Telehealth: Payer: Self-pay

## 2022-08-14 NOTE — Telephone Encounter (Signed)
  Follow up Call-     08/13/2022    7:55 AM  Call back number  Post procedure Call Back phone  # [phone]  Permission to leave phone message Yes     Patient questions:  Do you have a fever, pain , or abdominal swelling? No. Pain Score  0 *  Have you tolerated food without any problems? Yes.    Have you been able to return to your normal activities? Yes.    Do you have any questions about your discharge instructions: Diet   No. Medications  No. Follow up visit  No.  Do you have questions or concerns about your Care? No.  Actions: * If pain score is 4 or above: No action needed, pain <4.

## 2022-08-16 ENCOUNTER — Telehealth: Payer: Self-pay

## 2022-08-16 NOTE — Progress Notes (Signed)
Alison Gates, if patient is still having abdominal bloat, pls send her to our lab to have H. Pylori stool antigen stool test done. THX.

## 2022-08-16 NOTE — Telephone Encounter (Signed)
Author: Arnaldo Natal, NP Service: Gastroenterology Author Type: Nurse Practitioner  Filed: 08/16/2022  2:14 PM Encounter Date: 08/06/2022 Status: Signed  Editor: Arnaldo Natal, NP (Nurse Practitioner)   Viviann Spare, if patient is still having abdominal bloat, pls send her to our lab to have H. Pylori stool antigen stool test done. THX.     Unable to reach patient with interpreter services, vm box has not been set up.

## 2022-08-19 ENCOUNTER — Encounter: Payer: Self-pay | Admitting: Gastroenterology

## 2022-08-22 NOTE — Telephone Encounter (Signed)
Unable to reach patient with interpreter services. VM box not set up.

## 2022-08-23 NOTE — Telephone Encounter (Signed)
Spoke with patient with interpreter services & she denies any abdominal pain or bloating. Advised she call back if symptoms return. She verbalized all understanding.
